# Patient Record
Sex: Female | Born: 1940
Health system: Southern US, Community
[De-identification: ages and names within clinical notes are randomized; demographics above are authoritative.]

## PROBLEM LIST (undated history)

## (undated) DIAGNOSIS — Z972 Presence of dental prosthetic device (complete) (partial): Secondary | ICD-10-CM

## (undated) DIAGNOSIS — J449 Chronic obstructive pulmonary disease, unspecified: Secondary | ICD-10-CM

## (undated) DIAGNOSIS — R519 Headache, unspecified: Secondary | ICD-10-CM

## (undated) DIAGNOSIS — M1990 Unspecified osteoarthritis, unspecified site: Secondary | ICD-10-CM

## (undated) DIAGNOSIS — I10 Essential (primary) hypertension: Secondary | ICD-10-CM

## (undated) DIAGNOSIS — K529 Noninfective gastroenteritis and colitis, unspecified: Secondary | ICD-10-CM

## (undated) DIAGNOSIS — K5790 Diverticulosis of intestine, part unspecified, without perforation or abscess without bleeding: Secondary | ICD-10-CM

## (undated) DIAGNOSIS — K635 Polyp of colon: Secondary | ICD-10-CM

## (undated) DIAGNOSIS — R42 Dizziness and giddiness: Secondary | ICD-10-CM

## (undated) DIAGNOSIS — R0600 Dyspnea, unspecified: Secondary | ICD-10-CM

## (undated) DIAGNOSIS — K219 Gastro-esophageal reflux disease without esophagitis: Secondary | ICD-10-CM

## (undated) DIAGNOSIS — M419 Scoliosis, unspecified: Secondary | ICD-10-CM

## (undated) DIAGNOSIS — R51 Headache: Secondary | ICD-10-CM

## (undated) HISTORY — PX: HERNIA REPAIR: SHX51

## (undated) HISTORY — PX: ABDOMINAL HYSTERECTOMY: SHX81

---

## 2005-11-15 ENCOUNTER — Ambulatory Visit: Payer: Self-pay | Admitting: General Surgery

## 2005-11-19 ENCOUNTER — Ambulatory Visit: Payer: Self-pay | Admitting: General Surgery

## 2006-08-06 ENCOUNTER — Ambulatory Visit: Payer: Self-pay | Admitting: Internal Medicine

## 2009-09-05 ENCOUNTER — Ambulatory Visit: Payer: Self-pay | Admitting: Internal Medicine

## 2010-09-12 ENCOUNTER — Ambulatory Visit: Payer: Self-pay | Admitting: Internal Medicine

## 2011-02-17 ENCOUNTER — Ambulatory Visit: Payer: Self-pay | Admitting: Family Medicine

## 2011-09-07 ENCOUNTER — Emergency Department: Payer: Self-pay | Admitting: Emergency Medicine

## 2011-09-16 ENCOUNTER — Observation Stay: Payer: Self-pay | Admitting: Internal Medicine

## 2011-10-02 ENCOUNTER — Ambulatory Visit: Payer: Self-pay | Admitting: Unknown Physician Specialty

## 2011-10-24 ENCOUNTER — Ambulatory Visit: Payer: Self-pay

## 2011-11-20 ENCOUNTER — Ambulatory Visit: Payer: Self-pay | Admitting: Internal Medicine

## 2012-11-23 ENCOUNTER — Ambulatory Visit: Payer: Self-pay | Admitting: Internal Medicine

## 2013-02-04 ENCOUNTER — Emergency Department: Payer: Self-pay | Admitting: Emergency Medicine

## 2013-03-22 ENCOUNTER — Ambulatory Visit: Payer: Self-pay | Admitting: Orthopedic Surgery

## 2013-03-24 ENCOUNTER — Ambulatory Visit: Payer: Self-pay | Admitting: Anesthesiology

## 2013-03-24 LAB — POTASSIUM: Potassium: 3.6 mmol/L (ref 3.5–5.1)

## 2013-03-25 ENCOUNTER — Ambulatory Visit: Payer: Self-pay | Admitting: Orthopedic Surgery

## 2013-11-30 ENCOUNTER — Ambulatory Visit: Payer: Self-pay | Admitting: Internal Medicine

## 2014-04-06 DIAGNOSIS — M419 Scoliosis, unspecified: Secondary | ICD-10-CM | POA: Insufficient documentation

## 2014-04-06 DIAGNOSIS — G44209 Tension-type headache, unspecified, not intractable: Secondary | ICD-10-CM | POA: Insufficient documentation

## 2014-07-08 DIAGNOSIS — E538 Deficiency of other specified B group vitamins: Secondary | ICD-10-CM | POA: Insufficient documentation

## 2014-07-08 DIAGNOSIS — K219 Gastro-esophageal reflux disease without esophagitis: Secondary | ICD-10-CM | POA: Insufficient documentation

## 2015-03-03 NOTE — Op Note (Signed)
PATIENT NAME:  Karina Stone, Karina Stone MR#:  [id number] DATE OF BIRTH:  Mar 21, 1941  DATE OF PROCEDURE:  03/25/2013  PREOPERATIVE DIAGNOSIS:  Right gamekeeper thumb, ulnar collateral ligament injury.   POSTOPERATIVE DIAGNOSIS:  Right gamekeeper thumb, ulnar collateral ligament injury.   PROCEDURE:  Repair of ulnar collateral ligament, right thumb MCP joint.   ANESTHESIA:  General.   SURGEON:  Laurene Footman, M.D.   DESCRIPTION OF PROCEDURE:  The patient was brought to the operating room and after adequate general anesthesia was obtained, the right arm was prepped and draped in the usual sterile fashion with a tourniquet applied to the upper arm. After prepping and draping the arm in the usual sterile manner, patient identification and timeout procedures were completed. The arm was exsanguinated with an Esmarch and the tourniquet raised to 250 mmHg. An incision was made over the dorsal ulnar aspect of the MCP joint. The subcutaneous tissue was spread and the dorsal ulnar cutaneous nerve protected. The adductor aponeurosis was incised, and the ligament found to have been trapped under it. The ligament was freed up without much difficulty and had avulsed off the proximal phalanx. The bare area was then roughened with a small periosteal elevator and 2 suture anchors were placed after first predrilling at the appropriate location with a to DePuy Mitek Microfix QuickAnchor Plus with 3-0 suture attached. These sutures were then placed through the ligament in a mattress-type suture and when tightened restored ligament tension. The thumb was stable to exam at this point with stressing. The adductor aponeurosis then repaired using 4-0 Vicryl in a running manner. The wound was thoroughly irrigated at this point, and the skin closed with simple interrupted 4-0 nylon. Xeroform, 4 x 4's, Webril  and thumb spica brace were applied with the thumb in adduction.   ESTIMATED BLOOD LOSS: Minimal.   COMPLICATIONS: None.    SPECIMEN: None.   IMPLANTS:  DePuy Mitek Microfix QuickAnchor Plus x 2.   TOURNIQUET TIME: 26 minutes.    CONDITION: To recovery room stable.    ____________________________ Laurene Footman, MD mjm:dmm D: 03/25/2013 21:18:59 ET T: 03/25/2013 21:51:58 ET JOB#: [id number]  cc: Laurene Footman, MD, <Dictator> Laurene Footman MD ELECTRONICALLY SIGNED 03/26/2013 8:07

## 2015-09-27 DIAGNOSIS — K580 Irritable bowel syndrome with diarrhea: Secondary | ICD-10-CM | POA: Insufficient documentation

## 2016-02-02 DIAGNOSIS — L821 Other seborrheic keratosis: Secondary | ICD-10-CM | POA: Diagnosis not present

## 2016-02-02 DIAGNOSIS — L570 Actinic keratosis: Secondary | ICD-10-CM | POA: Diagnosis not present

## 2016-02-02 DIAGNOSIS — X32XXXA Exposure to sunlight, initial encounter: Secondary | ICD-10-CM | POA: Diagnosis not present

## 2016-02-02 DIAGNOSIS — Z85828 Personal history of other malignant neoplasm of skin: Secondary | ICD-10-CM | POA: Diagnosis not present

## 2016-07-09 DIAGNOSIS — I10 Essential (primary) hypertension: Secondary | ICD-10-CM | POA: Diagnosis not present

## 2016-07-09 DIAGNOSIS — E538 Deficiency of other specified B group vitamins: Secondary | ICD-10-CM | POA: Diagnosis not present

## 2016-07-09 DIAGNOSIS — Z0000 Encounter for general adult medical examination without abnormal findings: Secondary | ICD-10-CM | POA: Diagnosis not present

## 2016-07-16 DIAGNOSIS — D126 Benign neoplasm of colon, unspecified: Secondary | ICD-10-CM | POA: Diagnosis not present

## 2016-07-16 DIAGNOSIS — Z0000 Encounter for general adult medical examination without abnormal findings: Secondary | ICD-10-CM | POA: Diagnosis not present

## 2016-07-16 DIAGNOSIS — Z23 Encounter for immunization: Secondary | ICD-10-CM | POA: Diagnosis not present

## 2016-07-26 ENCOUNTER — Other Ambulatory Visit: Payer: Self-pay | Admitting: Internal Medicine

## 2016-07-26 DIAGNOSIS — Z1231 Encounter for screening mammogram for malignant neoplasm of breast: Secondary | ICD-10-CM

## 2016-07-30 ENCOUNTER — Ambulatory Visit: Payer: Self-pay | Attending: Internal Medicine

## 2016-08-20 DIAGNOSIS — Z800 Family history of malignant neoplasm of digestive organs: Secondary | ICD-10-CM | POA: Diagnosis not present

## 2016-08-20 DIAGNOSIS — K529 Noninfective gastroenteritis and colitis, unspecified: Secondary | ICD-10-CM | POA: Diagnosis not present

## 2016-08-20 DIAGNOSIS — Z86010 Personal history of colonic polyps: Secondary | ICD-10-CM | POA: Diagnosis not present

## 2016-09-13 ENCOUNTER — Encounter: Payer: Self-pay | Admitting: Radiology

## 2016-09-13 ENCOUNTER — Ambulatory Visit
Admission: RE | Admit: 2016-09-13 | Discharge: 2016-09-13 | Disposition: A | Discharge status: Home / Self Care | Payer: PPO | Source: Ambulatory Visit | Attending: Internal Medicine | Admitting: Internal Medicine

## 2016-09-13 DIAGNOSIS — Z1231 Encounter for screening mammogram for malignant neoplasm of breast: Secondary | ICD-10-CM | POA: Diagnosis not present

## 2016-10-24 DIAGNOSIS — L574 Cutis laxa senilis: Secondary | ICD-10-CM | POA: Diagnosis not present

## 2016-11-15 ENCOUNTER — Ambulatory Visit
Admission: RE | Admit: 2016-11-15 | Discharge: 2016-11-15 | Disposition: A | Discharge status: Home / Self Care | Payer: PPO | Source: Ambulatory Visit | Attending: Unknown Physician Specialty | Admitting: Unknown Physician Specialty

## 2016-11-15 ENCOUNTER — Ambulatory Visit: Payer: PPO | Admitting: Anesthesiology

## 2016-11-15 ENCOUNTER — Encounter
Admission: RE | Disposition: A | Discharge status: Home / Self Care | Payer: Self-pay | Source: Ambulatory Visit | Attending: Unknown Physician Specialty

## 2016-11-15 DIAGNOSIS — Z5309 Procedure and treatment not carried out because of other contraindication: Secondary | ICD-10-CM | POA: Insufficient documentation

## 2016-11-15 DIAGNOSIS — Z86010 Personal history of colonic polyps: Secondary | ICD-10-CM | POA: Insufficient documentation

## 2016-11-15 DIAGNOSIS — Z1211 Encounter for screening for malignant neoplasm of colon: Secondary | ICD-10-CM | POA: Diagnosis not present

## 2016-11-15 DIAGNOSIS — Z87891 Personal history of nicotine dependence: Secondary | ICD-10-CM | POA: Insufficient documentation

## 2016-11-15 DIAGNOSIS — R51 Headache: Secondary | ICD-10-CM | POA: Diagnosis not present

## 2016-11-15 HISTORY — DX: Headache: R51

## 2016-11-15 HISTORY — DX: Dyspnea, unspecified: R06.00

## 2016-11-15 HISTORY — DX: Headache, unspecified: R51.9

## 2016-11-15 HISTORY — DX: Diverticulosis of intestine, part unspecified, without perforation or abscess without bleeding: K57.90

## 2016-11-15 HISTORY — DX: Essential (primary) hypertension: I10

## 2016-11-15 HISTORY — DX: Chronic obstructive pulmonary disease, unspecified: J44.9

## 2016-11-15 HISTORY — DX: Noninfective gastroenteritis and colitis, unspecified: K52.9

## 2016-11-15 HISTORY — DX: Scoliosis, unspecified: M41.9

## 2016-11-15 HISTORY — DX: Polyp of colon: K63.5

## 2016-11-15 SURGERY — COLONOSCOPY WITH PROPOFOL
Anesthesia: General

## 2016-11-15 MED ORDER — SODIUM CHLORIDE 0.9 % IV SOLN
INTRAVENOUS | Status: DC
  Administered 2016-11-15: 10:00:00 via INTRAVENOUS

## 2016-11-15 MED ORDER — FLEET ENEMA 7-19 GM/118ML RE ENEM
1.0000 | ENEMA | Freq: Once | RECTAL | Status: AC
  Administered 2016-11-15: 1 via RECTAL

## 2016-11-15 NOTE — OR Nursing (Signed)
Results of fleets enema brown liquid stool. Not clear. Dr Vira Agar informed. md to make a decision after discussing with pt and family

## 2016-11-15 NOTE — OR Nursing (Signed)
Pt only completed 1/2 of prep . Spoke with dr Vira Agar who ordered fleets enema

## 2016-11-15 NOTE — Anesthesia Preprocedure Evaluation (Signed)
Anesthesia Evaluation  Patient identified by MRN, date of birth, ID band Patient awake    Reviewed: Allergy & Precautions, H&P , NPO status , Patient's Chart, lab work & pertinent test results  History of Anesthesia Complications Negative for: history of anesthetic complications  Airway Mallampati: III  TM Distance: <3 FB Neck ROM: limited    Dental  (+) Poor Dentition, Missing, Upper Dentures, Edentulous Lower   Pulmonary neg shortness of breath, COPD, former smoker,    Pulmonary exam normal breath sounds clear to auscultation       Cardiovascular Exercise Tolerance: Good hypertension, (-) angina(-) Past MI and (-) DOE Normal cardiovascular exam Rhythm:regular Rate:Normal     Neuro/Psych  Headaches, negative psych ROS   GI/Hepatic negative GI ROS, Neg liver ROS, neg GERD  ,  Endo/Other  negative endocrine ROS  Renal/GU negative Renal ROS  negative genitourinary   Musculoskeletal   Abdominal   Peds  Hematology negative hematology ROS (+)   Anesthesia Other Findings Past Medical History: No date: Chronic diarrhea No date: Colon polyps No date: COPD (chronic obstructive pulmonary disease) (* No date: Diverticulosis No date: Dyspnea No date: Headache No date: Hypertension No date: Thoracic scoliosis  Past Surgical History: No date: ABDOMINAL HYSTERECTOMY No date: HERNIA REPAIR  BMI    Body Mass Index:  23.30 kg/m      Reproductive/Obstetrics negative OB ROS                             Anesthesia Physical Anesthesia Plan  ASA: III  Anesthesia Plan: General   Post-op Pain Management:    Induction:   Airway Management Planned:   Additional Equipment:   Intra-op Plan:   Post-operative Plan:   Informed Consent: I have reviewed the patients History and Physical, chart, labs and discussed the procedure including the risks, benefits and alternatives for the proposed  anesthesia with the patient or authorized representative who has indicated his/her understanding and acceptance.   Dental Advisory Given  Plan Discussed with: Anesthesiologist, CRNA and Surgeon  Anesthesia Plan Comments:         Anesthesia Quick Evaluation

## 2016-11-15 NOTE — H&P (Signed)
Patient was not cleaned out and she was given a fleets enema and there was liquid stool showing evidence of not being clear enough to do a colonoscopy.  I offered her a choice of drinking more prep at this time or rescheduling and starting all over again at another time.  She chose to wait a few weeks or months and try all over again.

## 2016-11-29 ENCOUNTER — Ambulatory Visit: Admit: 2016-11-29 | Payer: PPO | Admitting: Unknown Physician Specialty

## 2016-11-29 SURGERY — COLONOSCOPY WITH PROPOFOL
Anesthesia: General

## 2017-01-09 DIAGNOSIS — L574 Cutis laxa senilis: Secondary | ICD-10-CM | POA: Diagnosis not present

## 2017-02-10 ENCOUNTER — Encounter: Payer: Self-pay | Admitting: *Deleted

## 2017-02-11 ENCOUNTER — Ambulatory Visit
Admission: RE | Admit: 2017-02-11 | Discharge: 2017-02-11 | Disposition: A | Discharge status: Home / Self Care | Payer: PPO | Source: Ambulatory Visit | Attending: Ophthalmology | Admitting: Ophthalmology

## 2017-02-11 ENCOUNTER — Ambulatory Visit: Payer: PPO | Admitting: Anesthesiology

## 2017-02-11 ENCOUNTER — Encounter
Admission: RE | Disposition: A | Discharge status: Home / Self Care | Payer: Self-pay | Source: Ambulatory Visit | Attending: Ophthalmology

## 2017-02-11 DIAGNOSIS — K219 Gastro-esophageal reflux disease without esophagitis: Secondary | ICD-10-CM | POA: Insufficient documentation

## 2017-02-11 DIAGNOSIS — J449 Chronic obstructive pulmonary disease, unspecified: Secondary | ICD-10-CM | POA: Insufficient documentation

## 2017-02-11 DIAGNOSIS — R51 Headache: Secondary | ICD-10-CM | POA: Diagnosis not present

## 2017-02-11 DIAGNOSIS — Z90710 Acquired absence of both cervix and uterus: Secondary | ICD-10-CM | POA: Insufficient documentation

## 2017-02-11 DIAGNOSIS — Z888 Allergy status to other drugs, medicaments and biological substances status: Secondary | ICD-10-CM | POA: Diagnosis not present

## 2017-02-11 DIAGNOSIS — T8131XA Disruption of external operation (surgical) wound, not elsewhere classified, initial encounter: Secondary | ICD-10-CM | POA: Diagnosis not present

## 2017-02-11 DIAGNOSIS — X58XXXA Exposure to other specified factors, initial encounter: Secondary | ICD-10-CM | POA: Diagnosis not present

## 2017-02-11 DIAGNOSIS — Z87891 Personal history of nicotine dependence: Secondary | ICD-10-CM | POA: Diagnosis not present

## 2017-02-11 DIAGNOSIS — I10 Essential (primary) hypertension: Secondary | ICD-10-CM | POA: Insufficient documentation

## 2017-02-11 DIAGNOSIS — Z885 Allergy status to narcotic agent status: Secondary | ICD-10-CM | POA: Insufficient documentation

## 2017-02-11 HISTORY — DX: Dizziness and giddiness: R42

## 2017-02-11 HISTORY — DX: Unspecified osteoarthritis, unspecified site: M19.90

## 2017-02-11 HISTORY — DX: Presence of dental prosthetic device (complete) (partial): Z97.2

## 2017-02-11 HISTORY — PX: BROW LIFT: SHX178

## 2017-02-11 HISTORY — DX: Gastro-esophageal reflux disease without esophagitis: K21.9

## 2017-02-11 SURGERY — BLEPHAROPLASTY
Anesthesia: Monitor Anesthesia Care | Site: Eye | Laterality: Bilateral | Wound class: Clean Contaminated

## 2017-02-11 MED ORDER — BSS IO SOLN
INTRAOCULAR | Status: DC | PRN
  Administered 2017-02-11: 5 mL via INTRAOCULAR

## 2017-02-11 MED ORDER — PROPOFOL 10 MG/ML IV BOLUS
INTRAVENOUS | Status: DC | PRN
  Administered 2017-02-11: 20 mg via INTRAVENOUS

## 2017-02-11 MED ORDER — TETRACAINE HCL 0.5 % OP SOLN
OPHTHALMIC | Status: DC | PRN
  Administered 2017-02-11: 2 [drp] via OPHTHALMIC

## 2017-02-11 MED ORDER — OXYCODONE HCL 5 MG/5ML PO SOLN: 5.0000 mg | Freq: Once | ORAL | Status: DC | PRN

## 2017-02-11 MED ORDER — LIDOCAINE-EPINEPHRINE 2 %-1:100000 IJ SOLN
INTRAMUSCULAR | Status: DC | PRN
  Administered 2017-02-11: 4.5 mL via OPHTHALMIC

## 2017-02-11 MED ORDER — MIDAZOLAM HCL 2 MG/2ML IJ SOLN
INTRAMUSCULAR | Status: DC | PRN
  Administered 2017-02-11: 2 mg via INTRAVENOUS

## 2017-02-11 MED ORDER — ONDANSETRON HCL 4 MG/2ML IJ SOLN: 4.0000 mg | Freq: Once | INTRAMUSCULAR | Status: DC | PRN

## 2017-02-11 MED ORDER — LACTATED RINGERS IV SOLN
INTRAVENOUS | Status: DC
  Administered 2017-02-11: 08:00:00 via INTRAVENOUS

## 2017-02-11 MED ORDER — PROPOFOL 500 MG/50ML IV EMUL
INTRAVENOUS | Status: DC | PRN
  Administered 2017-02-11: 25 ug/kg/min via INTRAVENOUS

## 2017-02-11 MED ORDER — FENTANYL CITRATE (PF) 100 MCG/2ML IJ SOLN: 25.0000 ug | INTRAMUSCULAR | Status: DC | PRN

## 2017-02-11 MED ORDER — ALFENTANIL 500 MCG/ML IJ INJ
INJECTION | INTRAMUSCULAR | Status: DC | PRN
  Administered 2017-02-11: 500 ug via INTRAVENOUS

## 2017-02-11 MED ORDER — ERYTHROMYCIN 5 MG/GM OP OINT: TOPICAL_OINTMENT | OPHTHALMIC | Status: DC | PRN

## 2017-02-11 MED ORDER — OXYCODONE HCL 5 MG PO TABS: 5.0000 mg | ORAL_TABLET | Freq: Once | ORAL | Status: DC | PRN

## 2017-02-11 SURGICAL SUPPLY — 38 items
ADHESIVE MASTISOL STRL (MISCELLANEOUS) ×3 IMPLANT
APPLICATOR COTTON TIP WD 3 STR (MISCELLANEOUS) ×3 IMPLANT
BLADE SURG 15 STRL LF DISP TIS (BLADE) ×1 IMPLANT
BLADE SURG 15 STRL SS (BLADE) ×2
CLOSURE WOUND 1/2 X4 (GAUZE/BANDAGES/DRESSINGS) ×1
CORD BIP STRL DISP 12FT (MISCELLANEOUS) ×3 IMPLANT
DRAPE HEAD BAR (DRAPES) ×3 IMPLANT
GAUZE SPONGE 4X4 12PLY STRL (GAUZE/BANDAGES/DRESSINGS) ×3 IMPLANT
GAUZE SPONGE NON-WVN 2X2 STRL (MISCELLANEOUS) ×10 IMPLANT
GLOVE SURG LX 7.0 MICRO (GLOVE) ×4
GLOVE SURG LX STRL 7.0 MICRO (GLOVE) ×2 IMPLANT
MARKER SKIN XFINE TIP W/RULER (MISCELLANEOUS) ×3 IMPLANT
NEEDLE FILTER BLUNT 18X 1/2SAF (NEEDLE) ×2
NEEDLE FILTER BLUNT 18X1 1/2 (NEEDLE) ×1 IMPLANT
NEEDLE HYPO 30X.5 LL (NEEDLE) ×6 IMPLANT
PACK DRAPE NASAL/ENT (PACKS) ×3 IMPLANT
SOL PREP PVP 2OZ (MISCELLANEOUS) ×3
SOLUTION PREP PVP 2OZ (MISCELLANEOUS) ×1 IMPLANT
SPONGE VERSALON 2X2 STRL (MISCELLANEOUS) ×20
STRIP CLOSURE SKIN 1/2X4 (GAUZE/BANDAGES/DRESSINGS) ×2 IMPLANT
SUT CHROMIC 4-0 (SUTURE)
SUT CHROMIC 4-0 M2 12X2 ARM (SUTURE)
SUT CHROMIC 5 0 P 3 (SUTURE) IMPLANT
SUT ETHILON 4 0 CL P 3 (SUTURE) IMPLANT
SUT MERSILENE 4-0 S-2 (SUTURE) IMPLANT
SUT PLAIN GUT (SUTURE) ×3 IMPLANT
SUT PROLENE 5 0 P 3 (SUTURE) ×3 IMPLANT
SUT PROLENE 6 0 P 1 18 (SUTURE) IMPLANT
SUT SILK 4 0 G 3 (SUTURE) IMPLANT
SUT VIC AB 5-0 P-3 18X BRD (SUTURE) IMPLANT
SUT VIC AB 5-0 P3 18 (SUTURE)
SUT VICRYL 6-0  S14 CTD (SUTURE) ×4
SUT VICRYL 6-0 S14 CTD (SUTURE) ×2 IMPLANT
SUT VICRYL 7 0 TG140 8 (SUTURE) IMPLANT
SUTURE CHRMC 4-0 M2 12X2 ARM (SUTURE) IMPLANT
SYR 3ML LL SCALE MARK (SYRINGE) ×3 IMPLANT
SYRINGE 10CC LL (SYRINGE) ×3 IMPLANT
WATER STERILE IRR 500ML POUR (IV SOLUTION) ×3 IMPLANT

## 2017-02-11 NOTE — Interval H&P Note (Signed)
History and Physical Interval Note:  02/11/2017 8:37 AM  Karina Stone  has presented today for surgery, with the diagnosis of  T81.31xA wound dehisence  The various methods of treatment have been discussed with the patient and family. After consideration of risks, benefits and other options for treatment, the patient has consented to  Procedure(s): bilateral secondary closure surgical wound dehisence extensive (Bilateral) as a surgical intervention .  The patient's history has been reviewed, patient examined, no change in status, stable for surgery.  I have reviewed the patient's chart and labs.  Questions were answered to the patient's satisfaction.     Vickki Muff, Teddrick Mallari M

## 2017-02-11 NOTE — Op Note (Signed)
Preoperative Diagnosis:   Wound dehiscence bilateral brows.    Postoperative Diagnosis: Same.   Procedure(s) Performed:   Bilateral secondary closure of surgical wound dehiscence-extensive  Teaching Surgeon: Philis Pique. Vickki Muff, M.D.   Assistants: None   Anesthesia: MAC  Specimens: None.  Estimated Blood Loss: Minimal.  Complications: None.  Operative Findings: None Dictated  PROCEDURE:   Allergies were reviewed and the patient is allergic to codeine; amitriptyline; and norvasc [amlodipine besylate]..   After the risks, benefits, complications and alternatives were discussed with the patient, appropriate informed consent was obtained.  The patient was then brought to the operating suite and reclined supine.  Timeout was conducted and the patient was sedated. Local anesthetic consisting of a 50-50 mixture of 2% lidocaine with epinephrine and 0.75% bupivacaine with added Hylenex was injected subcutaneously to the bilateral brow region(s) and down to the periosteum. After adequate local was instilled, the patient was prepped and draped in the usual sterile fashion for eyelid surgery.   Attention was turned to the right brow region.  The area of necrotic tissue was outlined with a sterile marking pen. A #15 blade was used to create a bevelled incision along the premarked incision line. A skin and subcutaneous tissue flap was then excised and hemostasis was obtained with bipolar cautery. Necrotic tissue was debridement. 10% Betadine solution was then painted on the wound and allowed to sit for 2 minutes by irrigation with balance salt solution. It was noted that no evidence of the previously placed 5-0 chromic sutures was present. The deep tissues were reapproximated with interrupted horizontal followed by vertical vertical 6-0 Vicryl sutures. The skin margin was reapproximated with 3 vertical mattress sutures followed by a running locking 5-0 Prolene suture. Attention was then turned to the  opposite brow region where the same procedure was performed in the same manner.    The patient tolerated the procedure well.  The wounds were dressed with Mastisol and half-inch Steri-Strips The patient was taken to the recovery area where she recovered without difficulty.  Post-Op Plan/Instructions:  The patient was instructed to try to keep her Steri-Strips dry for his many days possible.  Once the strip start to fall off on their own,  she  was instructed to use over-the-counter triple antibiotic ointment with no steroid on the brow sutures 4 times a day for the next 12 to 14 days. she was given a prescription for Percocet for pain control should Tylenol not be effective. she was asked to to follow up in 3 weeks time for suture removal or sooner as needed for problems.   Manveer Gomes M. Vickki Muff, M.D. Attending,Ophthalmology

## 2017-02-11 NOTE — H&P (Signed)
See the history and physical completed at [facility] on 02/06/17 and scanned into the chart.

## 2017-02-11 NOTE — Anesthesia Preprocedure Evaluation (Signed)
Anesthesia Evaluation  Patient identified by MRN, date of birth, ID band Patient awake    Reviewed: Allergy & Precautions, H&P , NPO status , Patient's Chart, lab work & pertinent test results, reviewed documented beta blocker date and time   Airway Mallampati: II  TM Distance: >3 FB Neck ROM: full    Dental  (+) Upper Dentures, Lower Dentures   Pulmonary shortness of breath, COPD, former smoker,    Pulmonary exam normal breath sounds clear to auscultation       Cardiovascular Exercise Tolerance: Good hypertension,  Rhythm:regular Rate:Normal     Neuro/Psych  Headaches, negative psych ROS   GI/Hepatic Neg liver ROS, GERD  ,  Endo/Other  negative endocrine ROS  Renal/GU negative Renal ROS  negative genitourinary   Musculoskeletal   Abdominal   Peds  Hematology negative hematology ROS (+)   Anesthesia Other Findings   Reproductive/Obstetrics negative OB ROS                             Anesthesia Physical Anesthesia Plan  ASA: II  Anesthesia Plan: MAC   Post-op Pain Management:    Induction:   Airway Management Planned:   Additional Equipment:   Intra-op Plan:   Post-operative Plan:   Informed Consent: I have reviewed the patients History and Physical, chart, labs and discussed the procedure including the risks, benefits and alternatives for the proposed anesthesia with the patient or authorized representative who has indicated his/her understanding and acceptance.   Dental Advisory Given  Plan Discussed with: CRNA  Anesthesia Plan Comments:         Anesthesia Quick Evaluation

## 2017-02-11 NOTE — Discharge Instructions (Signed)
INSTRUCTIONS FOLLOWING OCULOPLASTIC SURGERY Karina Dennie Maizes, MD  AFTER YOUR EYE SURGERY, THER ARE MANY THINGS [place] YOU, THE PATIENT, CAN DO TO ASSURE THE BEST POSSIBLE RESULT FROM YOUR OPERATION.  THIS SHEET SHOULD BE REFERRED TO WHENEVER QUESTIONS ARISE.  IF THERE ARE ANY QUESTIONS NOT ANSWERED HERE, DO NOT HESITATE TO CALL OUR OFFICE AT [phone] OR [phone].  THERE IS ALWAYS [facility] AVAILABLE TO CALL IF QUESTIONS OR PROBLEMS ARISE.  VISION: Your vision may be blurred and out of focus after surgery until you are able to stop using your ointment, swelling resolves and your eye(s) heal. This may take 1 to 2 weeks at the least.  If your vision becomes gradually more dim or dark, this is not normal and you need to call our office immediately.  EYE CARE: For the first 48 hours after surgery, use ice packs 4-5 times today and tomorrow - 10-15 minutes at a time  - to help reduce swelling and bruising.  Small bags of frozen peas or corn make good ice packs along with cloths soaked in ice water.  You are wearing a special type of dressing following surgery - do not try to peel this off until they have significantly loosen on their own (typically 3-4 days). KEEP THIS DEREESING DRY!!   Once the dressing does come off, start using over the counter triple antibiotic ointment 4 times a day on your stitches.  For the first  2 weeks following surgery, you will need to treat your stitches with great care.  If is OK to shower, but take care to not allow soapy water to run into your eye(s) to help reduce changes of infection.  You may gently clean the eyelashes and around the eye(s) with cotton balls and sterile water, BUT DO NOT RUB THE STITCHES VIGOROUSLY.  Keeping your stitches moist with ointment will help promote healing with minimal scar formation.  ACTIVITY: When you leave the surgery center, you should go home, rest and be inactive.  The eye(s) may feel scratchy and keeping the eyes closed will allow for  faster healing.  The first week following surgery, avoid straining (anything making the face turn red) or lifting over 20 pounds.  Additionally, avoid bending which causes your head to go below your waist.  Using your eyes will NOT harm them, so feel free to read, watch television, use the computer, etc as desired.  Driving depends on each individual, so check with your doctor if you have questions about driving.  MEDICATIONS:  You will be given a prescription for an ointment to use 4 times a day on your stitches.  You can use the ointment in your eyes if they feel scratchy or irritated.  If you eyelid(s) dont close completely when you sleep, put some ointment in your eyes before bedtime.  EMERGENCY: If you experience SEVERE EYE PAIN OR HEADACHE UNRELIEVED BY TYLENOL OR PERCOCET, NAUSEA OR VOMITING, WORSENING REDNESS, OR WORSENING VISION (ESPECIALLY VISION THAT WA INITIALLY BETTER) CALL [phone] OR [phone] DURING BUSINESS HOURS OR AFTER HOURS.  General Anesthesia, Adult, Care After These instructions provide you with information about caring for yourself after your procedure. Your health care provider may also give you more specific instructions. Your treatment has been planned according to current medical practices, but problems sometimes occur. Call your health care provider if you have any problems or questions after your procedure. What can I expect after the procedure? After the procedure, it is common to have:  Vomiting.  A  sore throat.  Mental slowness. It is common to feel:  Nauseous.  Cold or shivery.  Sleepy.  Tired.  Sore or achy, even in parts of your body where you did not have surgery. Follow these instructions at home: For at least 24 hours after the procedure:   Do not:  Participate in activities where you could fall or become injured.  Drive.  Use heavy machinery.  Drink alcohol.  Take sleeping pills or medicines that cause drowsiness.  Make  important decisions or sign legal documents.  Take care of children on your own.  Rest. Eating and drinking   If you vomit, drink water, juice, or soup when you can drink without vomiting.  Drink enough fluid to keep your urine clear or pale yellow.  Make sure you have little or no nausea before eating solid foods.  Follow the diet recommended by your health care provider. General instructions   Have a responsible adult stay with you until you are awake and alert.  Return to your normal activities as told by your health care provider. Ask your health care provider what activities are safe for you.  Take over-the-counter and prescription medicines only as told by your health care provider.  If you smoke, do not smoke without supervision.  Keep all follow-up visits as told by your health care provider. This is important. Contact a health care provider if:  You continue to have nausea or vomiting at home, and medicines are not helpful.  You cannot drink fluids or start eating again.  You cannot urinate after 8-12 hours.  You develop a skin rash.  You have fever.  You have increasing redness at the site of your procedure. Get help right away if:  You have difficulty breathing.  You have chest pain.  You have unexpected bleeding.  You feel that you are having a life-threatening or urgent problem. This information is not intended to replace advice given to you by your health care provider. Make sure you discuss any questions you have with your health care provider. Document Released: 02/03/2001 Document Revised: 04/01/2016 Document Reviewed: 10/12/2015 Elsevier Interactive Patient Education  2017 [employer].

## 2017-02-11 NOTE — Transfer of Care (Signed)
Immediate Anesthesia Transfer of Care Note  Patient: Karina Stone  Procedure(s) Performed: Procedure(s): bilateral secondary closure surgical wound dehisence extensive (Bilateral)  Patient Location: PACU  Anesthesia Type: MAC  Level of Consciousness: awake, alert  and patient cooperative  Airway and Oxygen Therapy: Patient Spontanous Breathing and Patient connected to supplemental oxygen  Post-op Assessment: Post-op Vital signs reviewed, Patient's Cardiovascular Status Stable, Respiratory Function Stable, Patent Airway and No signs of Nausea or vomiting  Post-op Vital Signs: Reviewed and stable  Complications: No apparent anesthesia complications

## 2017-02-11 NOTE — Anesthesia Postprocedure Evaluation (Signed)
Anesthesia Post Note  Patient: Karina Stone  Procedure(s) Performed: Procedure(s) (LRB): bilateral secondary closure surgical wound dehisence extensive (Bilateral)  Patient location during evaluation: PACU Anesthesia Type: MAC Level of consciousness: awake and alert Pain management: pain level controlled Vital Signs Assessment: post-procedure vital signs reviewed and stable Respiratory status: spontaneous breathing, nonlabored ventilation, respiratory function stable and patient connected to nasal cannula oxygen Cardiovascular status: stable and blood pressure returned to baseline Anesthetic complications: no    Alisa Graff

## 2017-02-11 NOTE — Interval H&P Note (Signed)
History and Physical Interval Note:  02/11/2017 8:37 AM  Karina Stone  has presented today for surgery, with the diagnosis of  T81.31xA wound dehisence  The various methods of treatment have been discussed with the patient and family. After consideration of risks, benefits and other options for treatment, the patient has consented to  Procedure(s): bilateral secondary closure surgical wound dehisence extensive (Bilateral) as a surgical intervention .  The patient's history has been reviewed, patient examined, no change in status, stable for surgery.  I have reviewed the patient's chart and labs.  Questions were answered to the patient's satisfaction.     Vickki Muff, Naquita Nappier M

## 2017-02-11 NOTE — Anesthesia Procedure Notes (Signed)
Procedure Name: MAC Date/Time: 02/11/2017 8:48 AM Performed by: Janna Arch Pre-anesthesia Checklist: Patient identified, Emergency Drugs available, Suction available and Patient being monitored Patient Re-evaluated:Patient Re-evaluated prior to inductionOxygen Delivery Method: Nasal cannula

## 2017-02-12 ENCOUNTER — Encounter: Payer: Self-pay | Admitting: Ophthalmology

## 2017-05-13 DIAGNOSIS — M79675 Pain in left toe(s): Secondary | ICD-10-CM | POA: Diagnosis not present

## 2017-05-13 DIAGNOSIS — M79674 Pain in right toe(s): Secondary | ICD-10-CM | POA: Diagnosis not present

## 2017-05-13 DIAGNOSIS — B351 Tinea unguium: Secondary | ICD-10-CM | POA: Diagnosis not present

## 2017-06-06 DIAGNOSIS — M79675 Pain in left toe(s): Secondary | ICD-10-CM | POA: Diagnosis not present

## 2017-06-06 DIAGNOSIS — M79674 Pain in right toe(s): Secondary | ICD-10-CM | POA: Diagnosis not present

## 2017-06-06 DIAGNOSIS — L03031 Cellulitis of right toe: Secondary | ICD-10-CM | POA: Diagnosis not present

## 2017-06-25 DIAGNOSIS — E782 Mixed hyperlipidemia: Secondary | ICD-10-CM | POA: Insufficient documentation

## 2017-06-25 DIAGNOSIS — I10 Essential (primary) hypertension: Secondary | ICD-10-CM | POA: Insufficient documentation

## 2017-07-10 DIAGNOSIS — Z0000 Encounter for general adult medical examination without abnormal findings: Secondary | ICD-10-CM | POA: Diagnosis not present

## 2017-07-10 DIAGNOSIS — E782 Mixed hyperlipidemia: Secondary | ICD-10-CM | POA: Diagnosis not present

## 2017-07-10 DIAGNOSIS — E538 Deficiency of other specified B group vitamins: Secondary | ICD-10-CM | POA: Diagnosis not present

## 2017-07-17 DIAGNOSIS — E782 Mixed hyperlipidemia: Secondary | ICD-10-CM | POA: Diagnosis not present

## 2017-07-17 DIAGNOSIS — Z79899 Other long term (current) drug therapy: Secondary | ICD-10-CM | POA: Diagnosis not present

## 2017-07-17 DIAGNOSIS — E538 Deficiency of other specified B group vitamins: Secondary | ICD-10-CM | POA: Diagnosis not present

## 2017-07-17 DIAGNOSIS — Z0000 Encounter for general adult medical examination without abnormal findings: Secondary | ICD-10-CM | POA: Diagnosis not present

## 2017-09-08 ENCOUNTER — Ambulatory Visit
Admission: RE | Admit: 2017-09-08 | Discharge: 2017-09-08 | Disposition: A | Discharge status: Home / Self Care | Payer: PPO | Source: Ambulatory Visit | Attending: Internal Medicine | Admitting: Internal Medicine

## 2017-09-08 ENCOUNTER — Other Ambulatory Visit: Payer: Self-pay | Admitting: Internal Medicine

## 2017-09-08 DIAGNOSIS — I10 Essential (primary) hypertension: Secondary | ICD-10-CM | POA: Diagnosis not present

## 2017-09-08 DIAGNOSIS — H539 Unspecified visual disturbance: Secondary | ICD-10-CM

## 2017-09-08 DIAGNOSIS — I739 Peripheral vascular disease, unspecified: Secondary | ICD-10-CM | POA: Insufficient documentation

## 2017-09-08 DIAGNOSIS — I6523 Occlusion and stenosis of bilateral carotid arteries: Secondary | ICD-10-CM | POA: Insufficient documentation

## 2017-09-08 DIAGNOSIS — R5383 Other fatigue: Secondary | ICD-10-CM | POA: Diagnosis not present

## 2017-09-08 DIAGNOSIS — R51 Headache: Secondary | ICD-10-CM | POA: Diagnosis not present

## 2017-09-15 DIAGNOSIS — F5104 Psychophysiologic insomnia: Secondary | ICD-10-CM | POA: Diagnosis not present

## 2017-09-15 DIAGNOSIS — I10 Essential (primary) hypertension: Secondary | ICD-10-CM | POA: Diagnosis not present

## 2017-09-15 DIAGNOSIS — Z23 Encounter for immunization: Secondary | ICD-10-CM | POA: Diagnosis not present

## 2018-05-05 DIAGNOSIS — M5416 Radiculopathy, lumbar region: Secondary | ICD-10-CM | POA: Diagnosis not present

## 2018-05-05 DIAGNOSIS — M4124 Other idiopathic scoliosis, thoracic region: Secondary | ICD-10-CM | POA: Diagnosis not present

## 2018-05-05 DIAGNOSIS — M9903 Segmental and somatic dysfunction of lumbar region: Secondary | ICD-10-CM | POA: Diagnosis not present

## 2018-05-05 DIAGNOSIS — M9902 Segmental and somatic dysfunction of thoracic region: Secondary | ICD-10-CM | POA: Diagnosis not present

## 2018-05-07 DIAGNOSIS — M4124 Other idiopathic scoliosis, thoracic region: Secondary | ICD-10-CM | POA: Diagnosis not present

## 2018-05-07 DIAGNOSIS — M9902 Segmental and somatic dysfunction of thoracic region: Secondary | ICD-10-CM | POA: Diagnosis not present

## 2018-05-07 DIAGNOSIS — M9903 Segmental and somatic dysfunction of lumbar region: Secondary | ICD-10-CM | POA: Diagnosis not present

## 2018-05-07 DIAGNOSIS — M5416 Radiculopathy, lumbar region: Secondary | ICD-10-CM | POA: Diagnosis not present

## 2018-05-08 DIAGNOSIS — M5416 Radiculopathy, lumbar region: Secondary | ICD-10-CM | POA: Diagnosis not present

## 2018-05-08 DIAGNOSIS — M9903 Segmental and somatic dysfunction of lumbar region: Secondary | ICD-10-CM | POA: Diagnosis not present

## 2018-05-08 DIAGNOSIS — M9902 Segmental and somatic dysfunction of thoracic region: Secondary | ICD-10-CM | POA: Diagnosis not present

## 2018-05-08 DIAGNOSIS — M4124 Other idiopathic scoliosis, thoracic region: Secondary | ICD-10-CM | POA: Diagnosis not present

## 2018-05-11 DIAGNOSIS — M9902 Segmental and somatic dysfunction of thoracic region: Secondary | ICD-10-CM | POA: Diagnosis not present

## 2018-05-11 DIAGNOSIS — M4124 Other idiopathic scoliosis, thoracic region: Secondary | ICD-10-CM | POA: Diagnosis not present

## 2018-05-11 DIAGNOSIS — M5416 Radiculopathy, lumbar region: Secondary | ICD-10-CM | POA: Diagnosis not present

## 2018-05-11 DIAGNOSIS — M9903 Segmental and somatic dysfunction of lumbar region: Secondary | ICD-10-CM | POA: Diagnosis not present

## 2018-05-12 DIAGNOSIS — M5416 Radiculopathy, lumbar region: Secondary | ICD-10-CM | POA: Diagnosis not present

## 2018-05-12 DIAGNOSIS — M9902 Segmental and somatic dysfunction of thoracic region: Secondary | ICD-10-CM | POA: Diagnosis not present

## 2018-05-12 DIAGNOSIS — M9903 Segmental and somatic dysfunction of lumbar region: Secondary | ICD-10-CM | POA: Diagnosis not present

## 2018-05-12 DIAGNOSIS — M4124 Other idiopathic scoliosis, thoracic region: Secondary | ICD-10-CM | POA: Diagnosis not present

## 2018-05-13 DIAGNOSIS — M9903 Segmental and somatic dysfunction of lumbar region: Secondary | ICD-10-CM | POA: Diagnosis not present

## 2018-05-13 DIAGNOSIS — M5416 Radiculopathy, lumbar region: Secondary | ICD-10-CM | POA: Diagnosis not present

## 2018-05-13 DIAGNOSIS — M9902 Segmental and somatic dysfunction of thoracic region: Secondary | ICD-10-CM | POA: Diagnosis not present

## 2018-05-13 DIAGNOSIS — M4124 Other idiopathic scoliosis, thoracic region: Secondary | ICD-10-CM | POA: Diagnosis not present

## 2018-05-18 DIAGNOSIS — M4124 Other idiopathic scoliosis, thoracic region: Secondary | ICD-10-CM | POA: Diagnosis not present

## 2018-05-18 DIAGNOSIS — M9902 Segmental and somatic dysfunction of thoracic region: Secondary | ICD-10-CM | POA: Diagnosis not present

## 2018-05-18 DIAGNOSIS — M5416 Radiculopathy, lumbar region: Secondary | ICD-10-CM | POA: Diagnosis not present

## 2018-05-18 DIAGNOSIS — M9903 Segmental and somatic dysfunction of lumbar region: Secondary | ICD-10-CM | POA: Diagnosis not present

## 2018-05-20 DIAGNOSIS — M4124 Other idiopathic scoliosis, thoracic region: Secondary | ICD-10-CM | POA: Diagnosis not present

## 2018-05-20 DIAGNOSIS — M9903 Segmental and somatic dysfunction of lumbar region: Secondary | ICD-10-CM | POA: Diagnosis not present

## 2018-05-20 DIAGNOSIS — M5416 Radiculopathy, lumbar region: Secondary | ICD-10-CM | POA: Diagnosis not present

## 2018-05-20 DIAGNOSIS — M9902 Segmental and somatic dysfunction of thoracic region: Secondary | ICD-10-CM | POA: Diagnosis not present

## 2018-05-22 DIAGNOSIS — M4124 Other idiopathic scoliosis, thoracic region: Secondary | ICD-10-CM | POA: Diagnosis not present

## 2018-05-22 DIAGNOSIS — M9903 Segmental and somatic dysfunction of lumbar region: Secondary | ICD-10-CM | POA: Diagnosis not present

## 2018-05-22 DIAGNOSIS — M9902 Segmental and somatic dysfunction of thoracic region: Secondary | ICD-10-CM | POA: Diagnosis not present

## 2018-05-22 DIAGNOSIS — M5416 Radiculopathy, lumbar region: Secondary | ICD-10-CM | POA: Diagnosis not present

## 2018-05-25 DIAGNOSIS — M4124 Other idiopathic scoliosis, thoracic region: Secondary | ICD-10-CM | POA: Diagnosis not present

## 2018-05-25 DIAGNOSIS — M9903 Segmental and somatic dysfunction of lumbar region: Secondary | ICD-10-CM | POA: Diagnosis not present

## 2018-05-25 DIAGNOSIS — M5416 Radiculopathy, lumbar region: Secondary | ICD-10-CM | POA: Diagnosis not present

## 2018-05-25 DIAGNOSIS — M9902 Segmental and somatic dysfunction of thoracic region: Secondary | ICD-10-CM | POA: Diagnosis not present

## 2018-05-26 DIAGNOSIS — M5416 Radiculopathy, lumbar region: Secondary | ICD-10-CM | POA: Diagnosis not present

## 2018-05-26 DIAGNOSIS — M9902 Segmental and somatic dysfunction of thoracic region: Secondary | ICD-10-CM | POA: Diagnosis not present

## 2018-05-26 DIAGNOSIS — M9903 Segmental and somatic dysfunction of lumbar region: Secondary | ICD-10-CM | POA: Diagnosis not present

## 2018-05-26 DIAGNOSIS — M4124 Other idiopathic scoliosis, thoracic region: Secondary | ICD-10-CM | POA: Diagnosis not present

## 2018-06-01 DIAGNOSIS — M9903 Segmental and somatic dysfunction of lumbar region: Secondary | ICD-10-CM | POA: Diagnosis not present

## 2018-06-01 DIAGNOSIS — M4124 Other idiopathic scoliosis, thoracic region: Secondary | ICD-10-CM | POA: Diagnosis not present

## 2018-06-01 DIAGNOSIS — M9902 Segmental and somatic dysfunction of thoracic region: Secondary | ICD-10-CM | POA: Diagnosis not present

## 2018-06-01 DIAGNOSIS — M5416 Radiculopathy, lumbar region: Secondary | ICD-10-CM | POA: Diagnosis not present

## 2018-06-03 DIAGNOSIS — M9902 Segmental and somatic dysfunction of thoracic region: Secondary | ICD-10-CM | POA: Diagnosis not present

## 2018-06-03 DIAGNOSIS — M9903 Segmental and somatic dysfunction of lumbar region: Secondary | ICD-10-CM | POA: Diagnosis not present

## 2018-06-03 DIAGNOSIS — M4124 Other idiopathic scoliosis, thoracic region: Secondary | ICD-10-CM | POA: Diagnosis not present

## 2018-06-03 DIAGNOSIS — M5416 Radiculopathy, lumbar region: Secondary | ICD-10-CM | POA: Diagnosis not present

## 2018-06-08 DIAGNOSIS — M9903 Segmental and somatic dysfunction of lumbar region: Secondary | ICD-10-CM | POA: Diagnosis not present

## 2018-06-08 DIAGNOSIS — M4124 Other idiopathic scoliosis, thoracic region: Secondary | ICD-10-CM | POA: Diagnosis not present

## 2018-06-08 DIAGNOSIS — M9902 Segmental and somatic dysfunction of thoracic region: Secondary | ICD-10-CM | POA: Diagnosis not present

## 2018-06-08 DIAGNOSIS — M5416 Radiculopathy, lumbar region: Secondary | ICD-10-CM | POA: Diagnosis not present

## 2018-06-10 DIAGNOSIS — M4124 Other idiopathic scoliosis, thoracic region: Secondary | ICD-10-CM | POA: Diagnosis not present

## 2018-06-10 DIAGNOSIS — M9902 Segmental and somatic dysfunction of thoracic region: Secondary | ICD-10-CM | POA: Diagnosis not present

## 2018-06-10 DIAGNOSIS — M9903 Segmental and somatic dysfunction of lumbar region: Secondary | ICD-10-CM | POA: Diagnosis not present

## 2018-06-10 DIAGNOSIS — M5416 Radiculopathy, lumbar region: Secondary | ICD-10-CM | POA: Diagnosis not present

## 2018-06-15 DIAGNOSIS — M5416 Radiculopathy, lumbar region: Secondary | ICD-10-CM | POA: Diagnosis not present

## 2018-06-15 DIAGNOSIS — M9903 Segmental and somatic dysfunction of lumbar region: Secondary | ICD-10-CM | POA: Diagnosis not present

## 2018-06-15 DIAGNOSIS — M4124 Other idiopathic scoliosis, thoracic region: Secondary | ICD-10-CM | POA: Diagnosis not present

## 2018-06-15 DIAGNOSIS — M9902 Segmental and somatic dysfunction of thoracic region: Secondary | ICD-10-CM | POA: Diagnosis not present

## 2018-06-17 DIAGNOSIS — M9902 Segmental and somatic dysfunction of thoracic region: Secondary | ICD-10-CM | POA: Diagnosis not present

## 2018-06-17 DIAGNOSIS — M4124 Other idiopathic scoliosis, thoracic region: Secondary | ICD-10-CM | POA: Diagnosis not present

## 2018-06-17 DIAGNOSIS — M5416 Radiculopathy, lumbar region: Secondary | ICD-10-CM | POA: Diagnosis not present

## 2018-06-17 DIAGNOSIS — M9903 Segmental and somatic dysfunction of lumbar region: Secondary | ICD-10-CM | POA: Diagnosis not present

## 2018-06-23 DIAGNOSIS — M9903 Segmental and somatic dysfunction of lumbar region: Secondary | ICD-10-CM | POA: Diagnosis not present

## 2018-06-23 DIAGNOSIS — M5416 Radiculopathy, lumbar region: Secondary | ICD-10-CM | POA: Diagnosis not present

## 2018-06-23 DIAGNOSIS — M4124 Other idiopathic scoliosis, thoracic region: Secondary | ICD-10-CM | POA: Diagnosis not present

## 2018-06-23 DIAGNOSIS — M9902 Segmental and somatic dysfunction of thoracic region: Secondary | ICD-10-CM | POA: Diagnosis not present

## 2018-06-30 DIAGNOSIS — M9903 Segmental and somatic dysfunction of lumbar region: Secondary | ICD-10-CM | POA: Diagnosis not present

## 2018-06-30 DIAGNOSIS — M5416 Radiculopathy, lumbar region: Secondary | ICD-10-CM | POA: Diagnosis not present

## 2018-06-30 DIAGNOSIS — M9902 Segmental and somatic dysfunction of thoracic region: Secondary | ICD-10-CM | POA: Diagnosis not present

## 2018-06-30 DIAGNOSIS — M4124 Other idiopathic scoliosis, thoracic region: Secondary | ICD-10-CM | POA: Diagnosis not present

## 2018-07-07 DIAGNOSIS — M5416 Radiculopathy, lumbar region: Secondary | ICD-10-CM | POA: Diagnosis not present

## 2018-07-07 DIAGNOSIS — M4124 Other idiopathic scoliosis, thoracic region: Secondary | ICD-10-CM | POA: Diagnosis not present

## 2018-07-07 DIAGNOSIS — M9903 Segmental and somatic dysfunction of lumbar region: Secondary | ICD-10-CM | POA: Diagnosis not present

## 2018-07-07 DIAGNOSIS — M9902 Segmental and somatic dysfunction of thoracic region: Secondary | ICD-10-CM | POA: Diagnosis not present

## 2018-07-14 DIAGNOSIS — Z79899 Other long term (current) drug therapy: Secondary | ICD-10-CM | POA: Diagnosis not present

## 2018-07-14 DIAGNOSIS — E782 Mixed hyperlipidemia: Secondary | ICD-10-CM | POA: Diagnosis not present

## 2018-07-14 DIAGNOSIS — E538 Deficiency of other specified B group vitamins: Secondary | ICD-10-CM | POA: Diagnosis not present

## 2018-07-20 DIAGNOSIS — E538 Deficiency of other specified B group vitamins: Secondary | ICD-10-CM | POA: Diagnosis not present

## 2018-07-20 DIAGNOSIS — E782 Mixed hyperlipidemia: Secondary | ICD-10-CM | POA: Diagnosis not present

## 2018-07-20 DIAGNOSIS — Z0000 Encounter for general adult medical examination without abnormal findings: Secondary | ICD-10-CM | POA: Diagnosis not present

## 2018-07-20 DIAGNOSIS — Z79899 Other long term (current) drug therapy: Secondary | ICD-10-CM | POA: Diagnosis not present

## 2018-07-21 DIAGNOSIS — M9903 Segmental and somatic dysfunction of lumbar region: Secondary | ICD-10-CM | POA: Diagnosis not present

## 2018-07-21 DIAGNOSIS — M4124 Other idiopathic scoliosis, thoracic region: Secondary | ICD-10-CM | POA: Diagnosis not present

## 2018-07-21 DIAGNOSIS — M9902 Segmental and somatic dysfunction of thoracic region: Secondary | ICD-10-CM | POA: Diagnosis not present

## 2018-07-21 DIAGNOSIS — M5416 Radiculopathy, lumbar region: Secondary | ICD-10-CM | POA: Diagnosis not present

## 2018-07-22 DIAGNOSIS — M9903 Segmental and somatic dysfunction of lumbar region: Secondary | ICD-10-CM | POA: Diagnosis not present

## 2018-07-22 DIAGNOSIS — M4124 Other idiopathic scoliosis, thoracic region: Secondary | ICD-10-CM | POA: Diagnosis not present

## 2018-07-22 DIAGNOSIS — M9902 Segmental and somatic dysfunction of thoracic region: Secondary | ICD-10-CM | POA: Diagnosis not present

## 2018-07-22 DIAGNOSIS — M5416 Radiculopathy, lumbar region: Secondary | ICD-10-CM | POA: Diagnosis not present

## 2018-07-24 DIAGNOSIS — M9902 Segmental and somatic dysfunction of thoracic region: Secondary | ICD-10-CM | POA: Diagnosis not present

## 2018-07-24 DIAGNOSIS — M9903 Segmental and somatic dysfunction of lumbar region: Secondary | ICD-10-CM | POA: Diagnosis not present

## 2018-07-24 DIAGNOSIS — M4124 Other idiopathic scoliosis, thoracic region: Secondary | ICD-10-CM | POA: Diagnosis not present

## 2018-07-24 DIAGNOSIS — M5416 Radiculopathy, lumbar region: Secondary | ICD-10-CM | POA: Diagnosis not present

## 2018-07-29 DIAGNOSIS — M9903 Segmental and somatic dysfunction of lumbar region: Secondary | ICD-10-CM | POA: Diagnosis not present

## 2018-07-29 DIAGNOSIS — M4124 Other idiopathic scoliosis, thoracic region: Secondary | ICD-10-CM | POA: Diagnosis not present

## 2018-07-29 DIAGNOSIS — M9902 Segmental and somatic dysfunction of thoracic region: Secondary | ICD-10-CM | POA: Diagnosis not present

## 2018-07-29 DIAGNOSIS — M5416 Radiculopathy, lumbar region: Secondary | ICD-10-CM | POA: Diagnosis not present

## 2018-08-03 DIAGNOSIS — M9903 Segmental and somatic dysfunction of lumbar region: Secondary | ICD-10-CM | POA: Diagnosis not present

## 2018-08-03 DIAGNOSIS — M5416 Radiculopathy, lumbar region: Secondary | ICD-10-CM | POA: Diagnosis not present

## 2018-08-03 DIAGNOSIS — M4124 Other idiopathic scoliosis, thoracic region: Secondary | ICD-10-CM | POA: Diagnosis not present

## 2018-08-03 DIAGNOSIS — M9902 Segmental and somatic dysfunction of thoracic region: Secondary | ICD-10-CM | POA: Diagnosis not present

## 2018-08-10 DIAGNOSIS — Z79899 Other long term (current) drug therapy: Secondary | ICD-10-CM | POA: Diagnosis not present

## 2018-08-10 DIAGNOSIS — Z23 Encounter for immunization: Secondary | ICD-10-CM | POA: Diagnosis not present

## 2018-08-10 DIAGNOSIS — I10 Essential (primary) hypertension: Secondary | ICD-10-CM | POA: Diagnosis not present

## 2018-08-17 DIAGNOSIS — M5416 Radiculopathy, lumbar region: Secondary | ICD-10-CM | POA: Diagnosis not present

## 2018-08-17 DIAGNOSIS — M9903 Segmental and somatic dysfunction of lumbar region: Secondary | ICD-10-CM | POA: Diagnosis not present

## 2018-08-17 DIAGNOSIS — M9902 Segmental and somatic dysfunction of thoracic region: Secondary | ICD-10-CM | POA: Diagnosis not present

## 2018-08-17 DIAGNOSIS — M4124 Other idiopathic scoliosis, thoracic region: Secondary | ICD-10-CM | POA: Diagnosis not present

## 2018-08-24 DIAGNOSIS — M5416 Radiculopathy, lumbar region: Secondary | ICD-10-CM | POA: Diagnosis not present

## 2018-08-24 DIAGNOSIS — M9903 Segmental and somatic dysfunction of lumbar region: Secondary | ICD-10-CM | POA: Diagnosis not present

## 2018-08-24 DIAGNOSIS — M4124 Other idiopathic scoliosis, thoracic region: Secondary | ICD-10-CM | POA: Diagnosis not present

## 2018-08-24 DIAGNOSIS — M9902 Segmental and somatic dysfunction of thoracic region: Secondary | ICD-10-CM | POA: Diagnosis not present

## 2018-08-31 DIAGNOSIS — M4124 Other idiopathic scoliosis, thoracic region: Secondary | ICD-10-CM | POA: Diagnosis not present

## 2018-08-31 DIAGNOSIS — M5416 Radiculopathy, lumbar region: Secondary | ICD-10-CM | POA: Diagnosis not present

## 2018-08-31 DIAGNOSIS — M9903 Segmental and somatic dysfunction of lumbar region: Secondary | ICD-10-CM | POA: Diagnosis not present

## 2018-08-31 DIAGNOSIS — M9902 Segmental and somatic dysfunction of thoracic region: Secondary | ICD-10-CM | POA: Diagnosis not present

## 2018-10-05 DIAGNOSIS — M5416 Radiculopathy, lumbar region: Secondary | ICD-10-CM | POA: Diagnosis not present

## 2018-10-05 DIAGNOSIS — M4124 Other idiopathic scoliosis, thoracic region: Secondary | ICD-10-CM | POA: Diagnosis not present

## 2018-10-05 DIAGNOSIS — M9903 Segmental and somatic dysfunction of lumbar region: Secondary | ICD-10-CM | POA: Diagnosis not present

## 2018-10-05 DIAGNOSIS — M9902 Segmental and somatic dysfunction of thoracic region: Secondary | ICD-10-CM | POA: Diagnosis not present

## 2018-11-09 DIAGNOSIS — L03031 Cellulitis of right toe: Secondary | ICD-10-CM | POA: Diagnosis not present

## 2018-11-10 ENCOUNTER — Encounter: Payer: Self-pay | Admitting: Emergency Medicine

## 2018-11-10 ENCOUNTER — Inpatient Hospital Stay
Admission: EM | Admit: 2018-11-10 | Discharge: 2018-11-12 | DRG: 392 | Disposition: A | Discharge status: Home / Self Care | Payer: PPO | Attending: Internal Medicine | Admitting: Internal Medicine

## 2018-11-10 ENCOUNTER — Other Ambulatory Visit: Payer: Self-pay

## 2018-11-10 ENCOUNTER — Emergency Department: Payer: PPO

## 2018-11-10 DIAGNOSIS — K219 Gastro-esophageal reflux disease without esophagitis: Secondary | ICD-10-CM | POA: Diagnosis present

## 2018-11-10 DIAGNOSIS — Z87891 Personal history of nicotine dependence: Secondary | ICD-10-CM

## 2018-11-10 DIAGNOSIS — L0291 Cutaneous abscess, unspecified: Secondary | ICD-10-CM | POA: Diagnosis not present

## 2018-11-10 DIAGNOSIS — R159 Full incontinence of feces: Secondary | ICD-10-CM | POA: Diagnosis not present

## 2018-11-10 DIAGNOSIS — K529 Noninfective gastroenteritis and colitis, unspecified: Secondary | ICD-10-CM | POA: Diagnosis present

## 2018-11-10 DIAGNOSIS — K5792 Diverticulitis of intestine, part unspecified, without perforation or abscess without bleeding: Secondary | ICD-10-CM | POA: Diagnosis present

## 2018-11-10 DIAGNOSIS — I10 Essential (primary) hypertension: Secondary | ICD-10-CM | POA: Diagnosis not present

## 2018-11-10 DIAGNOSIS — Z79899 Other long term (current) drug therapy: Secondary | ICD-10-CM | POA: Diagnosis not present

## 2018-11-10 DIAGNOSIS — Z90710 Acquired absence of both cervix and uterus: Secondary | ICD-10-CM

## 2018-11-10 DIAGNOSIS — K5732 Diverticulitis of large intestine without perforation or abscess without bleeding: Principal | ICD-10-CM | POA: Diagnosis present

## 2018-11-10 DIAGNOSIS — Z888 Allergy status to other drugs, medicaments and biological substances status: Secondary | ICD-10-CM

## 2018-11-10 DIAGNOSIS — Z23 Encounter for immunization: Secondary | ICD-10-CM | POA: Diagnosis not present

## 2018-11-10 DIAGNOSIS — Z79891 Long term (current) use of opiate analgesic: Secondary | ICD-10-CM | POA: Diagnosis not present

## 2018-11-10 DIAGNOSIS — J449 Chronic obstructive pulmonary disease, unspecified: Secondary | ICD-10-CM | POA: Diagnosis present

## 2018-11-10 DIAGNOSIS — Z885 Allergy status to narcotic agent status: Secondary | ICD-10-CM

## 2018-11-10 DIAGNOSIS — R109 Unspecified abdominal pain: Secondary | ICD-10-CM | POA: Diagnosis present

## 2018-11-10 DIAGNOSIS — Z8719 Personal history of other diseases of the digestive system: Secondary | ICD-10-CM

## 2018-11-10 LAB — URINALYSIS, COMPLETE (UACMP) WITH MICROSCOPIC
BACTERIA UA: NONE SEEN
Bilirubin Urine: NEGATIVE
GLUCOSE, UA: NEGATIVE mg/dL
KETONES UR: NEGATIVE mg/dL
NITRITE: NEGATIVE
PH: 6 (ref 5.0–8.0)
Protein, ur: NEGATIVE mg/dL
Specific Gravity, Urine: 1.013 (ref 1.005–1.030)

## 2018-11-10 LAB — COMPREHENSIVE METABOLIC PANEL
ALT: 12 U/L (ref 0–44)
AST: 18 U/L (ref 15–41)
Albumin: 3.9 g/dL (ref 3.5–5.0)
Alkaline Phosphatase: 64 U/L (ref 38–126)
Anion gap: 9 (ref 5–15)
BUN: 22 mg/dL (ref 8–23)
CHLORIDE: 97 mmol/L — AB (ref 98–111)
CO2: 30 mmol/L (ref 22–32)
Calcium: 9 mg/dL (ref 8.9–10.3)
Creatinine, Ser: 0.83 mg/dL (ref 0.44–1.00)
Glucose, Bld: 102 mg/dL — ABNORMAL HIGH (ref 70–99)
POTASSIUM: 3.3 mmol/L — AB (ref 3.5–5.1)
SODIUM: 136 mmol/L (ref 135–145)
Total Bilirubin: 0.5 mg/dL (ref 0.3–1.2)
Total Protein: 7.4 g/dL (ref 6.5–8.1)

## 2018-11-10 LAB — CBC
HEMATOCRIT: 39.6 % (ref 36.0–46.0)
HEMOGLOBIN: 12.9 g/dL (ref 12.0–15.0)
MCH: 30.6 pg (ref 26.0–34.0)
MCHC: 32.6 g/dL (ref 30.0–36.0)
MCV: 94.1 fL (ref 80.0–100.0)
NRBC: 0 % (ref 0.0–0.2)
Platelets: 228 10*3/uL (ref 150–400)
RBC: 4.21 MIL/uL (ref 3.87–5.11)
RDW: 11.8 % (ref 11.5–15.5)
WBC: 10 10*3/uL (ref 4.0–10.5)

## 2018-11-10 LAB — LIPASE, BLOOD: LIPASE: 25 U/L (ref 11–51)

## 2018-11-10 MED ORDER — ENOXAPARIN SODIUM 40 MG/0.4ML ~~LOC~~ SOLN
40.0000 mg | SUBCUTANEOUS | Status: DC
  Administered 2018-11-10 – 2018-11-11 (×2): 40 mg via SUBCUTANEOUS
  Filled 2018-11-10 (×2): qty 0.4

## 2018-11-10 MED ORDER — IOHEXOL 300 MG/ML  SOLN
75.0000 mL | Freq: Once | INTRAMUSCULAR | Status: AC | PRN
  Administered 2018-11-10: 75 mL via INTRAVENOUS

## 2018-11-10 MED ORDER — ACETAMINOPHEN 325 MG PO TABS
650.0000 mg | ORAL_TABLET | Freq: Four times a day (QID) | ORAL | Status: DC | PRN
  Administered 2018-11-11: 650 mg via ORAL
  Filled 2018-11-10: qty 2

## 2018-11-10 MED ORDER — CHLORTHALIDONE 25 MG PO TABS
25.0000 mg | ORAL_TABLET | Freq: Every day | ORAL | Status: DC
  Administered 2018-11-11 – 2018-11-12 (×2): 25 mg via ORAL
  Filled 2018-11-10 (×2): qty 1

## 2018-11-10 MED ORDER — TRAMADOL HCL 50 MG PO TABS: 50.0000 mg | ORAL_TABLET | Freq: Four times a day (QID) | ORAL | Status: DC | PRN

## 2018-11-10 MED ORDER — ACETAMINOPHEN 325 MG PO TABS: 650.0000 mg | ORAL_TABLET | Freq: Four times a day (QID) | ORAL | Status: DC | PRN

## 2018-11-10 MED ORDER — ONDANSETRON HCL 4 MG PO TABS: 4.0000 mg | ORAL_TABLET | Freq: Four times a day (QID) | ORAL | Status: DC | PRN

## 2018-11-10 MED ORDER — ATENOLOL 50 MG PO TABS
50.0000 mg | ORAL_TABLET | Freq: Every day | ORAL | Status: DC
  Administered 2018-11-11 – 2018-11-12 (×2): 50 mg via ORAL
  Filled 2018-11-10 (×2): qty 1

## 2018-11-10 MED ORDER — PNEUMOCOCCAL VAC POLYVALENT 25 MCG/0.5ML IJ INJ
0.5000 mL | INJECTION | INTRAMUSCULAR | Status: AC
  Administered 2018-11-11: 0.5 mL via INTRAMUSCULAR
  Filled 2018-11-10: qty 0.5

## 2018-11-10 MED ORDER — MECLIZINE HCL 25 MG PO TABS
25.0000 mg | ORAL_TABLET | Freq: Three times a day (TID) | ORAL | Status: DC | PRN
  Filled 2018-11-10: qty 1

## 2018-11-10 MED ORDER — ATENOLOL-CHLORTHALIDONE 50-25 MG PO TABS: 1.0000 | ORAL_TABLET | Freq: Every day | ORAL | Status: DC

## 2018-11-10 MED ORDER — POTASSIUM CHLORIDE CRYS ER 20 MEQ PO TBCR
40.0000 meq | EXTENDED_RELEASE_TABLET | Freq: Once | ORAL | Status: AC
  Administered 2018-11-10: 40 meq via ORAL
  Filled 2018-11-10: qty 2

## 2018-11-10 MED ORDER — METRONIDAZOLE IN NACL 5-0.79 MG/ML-% IV SOLN
500.0000 mg | Freq: Once | INTRAVENOUS | Status: AC
  Administered 2018-11-10: 500 mg via INTRAVENOUS
  Filled 2018-11-10: qty 100

## 2018-11-10 MED ORDER — SODIUM CHLORIDE 0.9 % IV SOLN
3.0000 g | Freq: Four times a day (QID) | INTRAVENOUS | Status: DC
  Administered 2018-11-10 – 2018-11-12 (×7): 3 g via INTRAVENOUS
  Filled 2018-11-10 (×10): qty 3

## 2018-11-10 MED ORDER — ACETAMINOPHEN 650 MG RE SUPP: 650.0000 mg | Freq: Four times a day (QID) | RECTAL | Status: DC | PRN

## 2018-11-10 MED ORDER — CIPROFLOXACIN IN D5W 400 MG/200ML IV SOLN
400.0000 mg | Freq: Once | INTRAVENOUS | Status: AC
  Administered 2018-11-10: 400 mg via INTRAVENOUS
  Filled 2018-11-10: qty 200

## 2018-11-10 MED ORDER — IOPAMIDOL (ISOVUE-300) INJECTION 61%
30.0000 mL | Freq: Once | INTRAVENOUS | Status: AC | PRN
Start: 2018-11-10 — End: 2018-11-10
  Administered 2018-11-10: 30 mL via ORAL

## 2018-11-10 MED ORDER — SODIUM CHLORIDE 0.9 % IV SOLN
INTRAVENOUS | Status: DC
  Administered 2018-11-10 – 2018-11-11 (×2): via INTRAVENOUS

## 2018-11-10 MED ORDER — PANTOPRAZOLE SODIUM 40 MG PO TBEC
40.0000 mg | DELAYED_RELEASE_TABLET | Freq: Every day | ORAL | Status: DC
  Administered 2018-11-11 – 2018-11-12 (×2): 40 mg via ORAL
  Filled 2018-11-10 (×2): qty 1

## 2018-11-10 MED ORDER — ONDANSETRON HCL 4 MG/2ML IJ SOLN: 4.0000 mg | Freq: Four times a day (QID) | INTRAMUSCULAR | Status: DC | PRN

## 2018-11-10 NOTE — ED Notes (Signed)
Report given to receiving nurse. Patient to floor via stretcher.

## 2018-11-10 NOTE — ED Notes (Signed)
Patient off unit to CT

## 2018-11-10 NOTE — ED Triage Notes (Signed)
Patient reports for the last 2-3 years she has had intermittent episodes of diarrhea. States her rectal sphincter does not close completely so she is not able to hold in stool. Patient has been followed by PCP for same with no definite answers. States for the last 2 days she has also had increasing abdominal pain and back pain which is what prompted her to come.

## 2018-11-10 NOTE — ED Notes (Signed)
MRI on phone to screen patient. Lunch box provided to patient and family members.

## 2018-11-10 NOTE — ED Notes (Signed)
ED TO INPATIENT HANDOFF REPORT  Name/Age/Gender Karina Stone 77 y.o. female  Code Status   Home/SNF/Other From home   Chief Complaint abd and back pain diarrhea  Level of Care/Admitting Diagnosis ED Disposition    ED Disposition Condition [place]: [place] [[id number]]  Level of Care: Med-Surg [16]  Diagnosis: Acute diverticulitis [[id number]]  Admitting Physician: Dustin Flock [[id number]]  Attending Physician: Dustin Flock [[id number]]  Estimated length of stay: past midnight tomorrow  Certification:: I certify this patient will need inpatient services for at least 2 midnights  PT Class (Do Not Modify): Inpatient [101]  PT Acc Code (Do Not Modify): Private [1]       Medical History Past Medical History:  Diagnosis Date  . Arthritis    finger  . Chronic diarrhea   . Colon polyps   . COPD (chronic obstructive pulmonary disease) ([place])   . Diverticulosis   . Dyspnea   . GERD (gastroesophageal reflux disease)   . Headache   . Hypertension   . Thoracic scoliosis   . Vertigo   . Wears dentures    full upper and lower    Allergies Allergies  Allergen Reactions  . Codeine     headache  . Amitriptyline Palpitations  . Norvasc [Amlodipine Besylate] Palpitations    IV Location/Drains/Wounds Patient Lines/Drains/Airways Status   Active Line/Drains/Airways    Name:   Placement date:   Placement time:   Site:   Days:   Peripheral IV 11/10/18 Right Antecubital   11/10/18    1301    Antecubital   less than 1   Incision (Closed) 02/11/17 Eye Other (Comment)   02/11/17    0913     637          Labs/Imaging Results for orders placed or performed during the hospital encounter of 11/10/18 (from the past 48 hour(s))  Lipase, blood     Status: None   Collection Time: 11/10/18 10:18 AM  Result Value Ref Range   Lipase 25 11 - 51 U/L    Comment: Performed at [facility], [address] [place], [place] [address]   Comprehensive metabolic panel     Status: Abnormal   Collection Time: 11/10/18 10:18 AM  Result Value Ref Range   Sodium 136 135 - 145 mmol/L   Potassium 3.3 (L) 3.5 - 5.1 mmol/L   Chloride 97 (L) 98 - 111 mmol/L   CO2 30 22 - 32 mmol/L   Glucose, Bld 102 (H) 70 - 99 mg/dL   BUN 22 8 - 23 mg/dL   Creatinine, Ser 0.83 0.44 - 1.00 mg/dL   Calcium 9.0 8.9 - 10.3 mg/dL   Total Protein 7.4 6.5 - 8.1 g/dL   Albumin 3.9 3.5 - 5.0 g/dL   AST 18 15 - 41 U/L   ALT 12 0 - 44 U/L   Alkaline Phosphatase 64 38 - 126 U/L   Total Bilirubin 0.5 0.3 - 1.2 mg/dL   GFR calc non Af Amer >60 >60 mL/min   GFR calc Af Amer >60 >60 mL/min   Anion gap 9 5 - 15    Comment: Performed at [facility], [address] [place], [place] [address]  CBC     Status: None   Collection Time: 11/10/18 10:18 AM  Result Value Ref Range   WBC 10.0 4.0 - 10.5 K/uL   RBC 4.21 3.87 - 5.11 MIL/uL   Hemoglobin 12.9 12.0 -  15.0 g/dL   HCT 39.6 36.0 - 46.0 %   MCV 94.1 80.0 - 100.0 fL   MCH 30.6 26.0 - 34.0 pg   MCHC 32.6 30.0 - 36.0 g/dL   RDW 11.8 11.5 - 15.5 %   Platelets 228 150 - 400 K/uL   nRBC 0.0 0.0 - 0.2 %    Comment: Performed at [facility], [address] [place], [place] [address]  Urinalysis, Complete w Microscopic     Status: Abnormal   Collection Time: 11/10/18 10:18 AM  Result Value Ref Range   Color, Urine YELLOW (A) YELLOW   APPearance CLEAR (A) CLEAR   Specific Gravity, Urine 1.013 1.005 - 1.030   pH 6.0 5.0 - 8.0   Glucose, UA NEGATIVE NEGATIVE mg/dL   Hgb urine dipstick MODERATE (A) NEGATIVE   Bilirubin Urine NEGATIVE NEGATIVE   Ketones, ur NEGATIVE NEGATIVE mg/dL   Protein, ur NEGATIVE NEGATIVE mg/dL   Nitrite NEGATIVE NEGATIVE   Leukocytes, UA LARGE (A) NEGATIVE   RBC / HPF 0-5 0 - 5 RBC/hpf   WBC, UA 11-20 0 - 5 WBC/hpf   Bacteria, UA NONE SEEN NONE SEEN   Squamous Epithelial / LPF 6-10 0 - 5   Mucus PRESENT    Hyaline Casts, UA PRESENT    Amorphous Crystal  PRESENT     Comment: Performed at [facility], [address] [place], [place] [address]   Ct Abdomen Pelvis W Contrast  Result Date: 11/10/2018 CLINICAL DATA:  Abdominal pain with diarrhea EXAM: CT ABDOMEN AND PELVIS WITH CONTRAST TECHNIQUE: Multidetector CT imaging of the abdomen and pelvis was performed using the standard protocol following bolus administration of intravenous contrast. CONTRAST:  66mL OMNIPAQUE IOHEXOL 300 MG/ML  SOLN COMPARISON:  None. FINDINGS: Lower chest: There is atelectatic change in left base region. There is no lung base consolidation. Hepatobiliary: The liver contour is subtly lobular. No focal liver lesions are evident. Gallbladder wall is not appreciably thickened. There is no biliary duct dilatation. Pancreas: No pancreatic mass or inflammatory focus. Spleen: They were is a calcified granuloma in the spleen. Spleen otherwise appears unremarkable. Adrenals/Urinary Tract: Right adrenal appears normal. There is a x 8 mm nodular opacity in the left adrenal which has attenuation value in the indeterminate range for an adrenal mass. Kidneys bilaterally show no evident mass or hydronephrosis on either side. There is an extrarenal pelvis on each side, an anatomic variant. There is no renal or ureteral calculus on either side. Urinary bladder is midline with wall thickness within normal limits. Stomach/Bowel: There is extensive wall thickening in the distal sigmoid colon with several irregular diverticula. There is perinephric stranding and fluid between the diverticulum and urinary bladder. There is questionable early phlegmon development between the urinary bladder an the area of sigmoid diverticulitis. This questionable early phlegmon measures 2.6 x 1.8 cm. There is no well-defined abscess. No perforation is appreciable in this area. Elsewhere, there is no appreciable bowel wall or mesenteric thickening. There is no evident bowel obstruction. There is no appreciable free  air or portal venous air. There is moderate stool in the colon. Vascular/Lymphatic: There is extensive aortoiliac atherosclerosis. There is subtotal occlusion at the aortic bifurcation and origins of each internal carotid artery. No aneurysm. There is calcification in the major mesenteric arterial vessels without frank major mesenteric arterial vascular occlusion. There is no evident adenopathy in the abdomen or pelvis. Reproductive: Uterus is absent.  No pelvic mass is appreciable. Other: There is postoperative change  in the anterior abdominal wall. No hernia present in this area. There is no periappendiceal region inflammation. Question absence of the appendix. No abscess or ascites is appreciable in the abdomen or pelvis. Musculoskeletal: There is anterior wedging of the T11 vertebral body. There is multilevel arthropathy in the lumbar spine. There is mid lumbar levoscoliosis. There are no blastic or lytic bone lesions. There is no intramuscular or abdominal wall lesion. IMPRESSION: 1. Diverticulitis involving the distal sigmoid colon with irregular diverticula and wall thickening in this region. There is extensive stranding surrounding the distal sigmoid colon. There is questionable early phlegmon development between the area of diverticulitis in the distal sigmoid colon in the posterior bladder. Note that the bladder wall is not thickened in this area. No frank abscess. No perforation evident. 2. No evident bowel obstruction. No abscess elsewhere in the abdomen or pelvis. No periappendiceal region inflammation. Question previous appendectomy. 3. Extensive aortoiliac atherosclerosis with subtotal occlusion at the bifurcation and origins of the iliac arteries. 4. Subtle lobular contour of the liver. Question a degree of underlying hepatic cirrhosis. 5.  No renal or ureteral calculus.  No hydronephrosis. 6.  Calcified splenic granuloma noted. 7.  Uterus absent. Aortic Atherosclerosis (ICD10-I70.0). Electronically  Signed   By: Lowella Grip III M.D.   On: 11/10/2018 15:39    Pending Labs Unresulted Labs (From admission, onward)    Start     Ordered   11/10/18 1818  Osmolality, stool  Once,   STAT     11/10/18 1817   Signed and Held  CBC  (enoxaparin (LOVENOX)    CrCl >/= 30 ml/min)  Once,   R    Comments:  Baseline for enoxaparin therapy IF NOT ALREADY DRAWN.  Notify MD if PLT < 100 K.    Signed and Held   Signed and Held  Creatinine, serum  (enoxaparin (LOVENOX)    CrCl >/= 30 ml/min)  Once,   R    Comments:  Baseline for enoxaparin therapy IF NOT ALREADY DRAWN.    Signed and Held   Signed and Held  Creatinine, serum  (enoxaparin (LOVENOX)    CrCl >/= 30 ml/min)  Weekly,   R    Comments:  while on enoxaparin therapy    Signed and Held   Signed and Held  CBC  Tomorrow morning,   R     Signed and Held   Signed and Held  Basic metabolic panel  Tomorrow morning,   R     Signed and Held          Vitals/Pain Today's Vitals   11/10/18 1302 11/10/18 1619 11/10/18 1630 11/10/18 1859  BP: (!) 150/90 122/74 118/73 128/64  Pulse: 66 67  66  Resp: 18 18  18   Temp: 98.3 F (36.8 C) 98.6 F (37 C)  98.4 F (36.9 C)  TempSrc: Oral Oral  Oral  SpO2: 96% 99%  99%  Weight:      Height:      PainSc: 0-No pain 0-No pain  0-No pain    Isolation Precautions No active isolations  Medications Medications  Ampicillin-Sulbactam (UNASYN) 3 g in sodium chloride 0.9 % 100 mL IVPB (0 g Intravenous Stopped 11/10/18 1940)  iopamidol (ISOVUE-300) 61 % injection 30 mL (30 mLs Oral Contrast Given 11/10/18 1341)  iohexol (OMNIPAQUE) 300 MG/ML solution 75 mL (75 mLs Intravenous Contrast Given 11/10/18 1517)  ciprofloxacin (CIPRO) IVPB 400 mg (0 mg Intravenous Stopped 11/10/18 1850)  metroNIDAZOLE (FLAGYL) IVPB 500 mg (0  mg Intravenous Stopped 11/10/18 1838)  potassium chloride SA (K-DUR,KLOR-CON) CR tablet 40 mEq (40 mEq Oral Given 11/10/18 1856)    Mobility ambulatory

## 2018-11-10 NOTE — ED Notes (Signed)
Report having abd movement with increased episodes of diarrhea. Reports flank pain to left side. md at bedside to assess patient. Awaiting urine specimen.

## 2018-11-10 NOTE — ED Provider Notes (Signed)
[facility] Emergency Department Provider Note   ____________________________________________   First MD Initiated Contact with Patient 11/10/18 1205     (approximate)  I have reviewed the triage vital signs and the nursing notes.   HISTORY  Chief Complaint Abdominal Pain and Diarrhea    HP Karina Stone is a 77 y.o. female who reports 5 years of gradually worsening diarrhea.  She reports the diarrhea comes without her knowing it is happening.  She can feel it coming.  She says she has a loose rectal sphincter and just leaks poop.  This is been getting more frequent.  The last 2 or 3 days she has been having belly pain is and back pain especially at night and it is very bothersome wakes her up and she needs to get up and move before go away.  She reports her primary care doctor is checked out her stool to see if there was any infection and has not found any.   Past Medical History:  Diagnosis Date  . Arthritis    finger  . Chronic diarrhea   . Colon polyps   . COPD (chronic obstructive pulmonary disease) ([place])   . Diverticulosis   . Dyspnea   . GERD (gastroesophageal reflux disease)   . Headache   . Hypertension   . Thoracic scoliosis   . Vertigo   . Wears dentures    full upper and lower    Patient Active Problem List   Diagnosis Date Noted  . Acute diverticulitis 11/10/2018    Past Surgical History:  Procedure Laterality Date  . ABDOMINAL HYSTERECTOMY    . BROW LIFT Bilateral 02/11/2017   Procedure: bilateral secondary closure surgical wound dehisence extensive;  Surgeon: Karle Starch, MD;  Location: [facility];  Service: Ophthalmology;  Laterality: Bilateral;  . HERNIA REPAIR      Prior to Admission medications   Medication Sig Start Date End Date Taking? Authorizing Provider  acetaminophen (TYLENOL) 325 MG tablet Take 650 mg by mouth every 6 (six) hours as needed for mild pain or moderate pain.    Yes [provider]    atenolol-chlorthalidone (TENORETIC 50) 50-25 MG tablet Take 1 tablet by mouth daily.   Yes [provider]  cholecalciferol (VITAMIN D) 1000 units tablet Take 2,000 Units by mouth daily.   Yes [provider]  meclizine (ANTIVERT) 25 MG tablet Take 25 mg by mouth 3 (three) times daily as needed for dizziness.   Yes [provider]  omeprazole (PRILOSEC) 20 MG capsule Take 20 mg by mouth daily.   Yes [provider]  traMADol (ULTRAM) 50 MG tablet Take 50 mg by mouth 2 (two) times daily as needed for moderate pain.    Yes [provider]  vitamin B-12 (CYANOCOBALAMIN) 1000 MCG tablet Take 1,000 mcg by mouth daily.   Yes [provider]    Allergies Codeine; Amitriptyline; and Norvasc [amlodipine besylate]  Family History  Problem Relation Age of Onset  . Breast cancer Neg Hx     Social History Social History   Tobacco Use  . Smoking status: Former Smoker    Last attempt to quit: 11/12/1991    Years since quitting: 27.0  . Smokeless tobacco: Never Used  Substance Use Topics  . Alcohol use: No  . Drug use: No    Review of Systems  Constitutional: No fever/chills Eyes: No visual changes. ENT: No sore throat. Cardiovascular: Denies chest pain. Respiratory: Denies shortness of  breath. Gastrointestinal: See HPI Genitourinary: Negative for dysuria. Musculoskeletal: Negative for back pain. Skin: Negative for rash. Neurological: Negative for headaches, focal weakness   ____________________________________________   PHYSICAL EXAM:  VITAL SIGNS: ED Triage Vitals  Enc Vitals Group     BP 11/10/18 1015 (!) 148/74     Pulse Rate 11/10/18 1015 68     Resp 11/10/18 1015 16     Temp 11/10/18 1015 99 F (37.2 C)     Temp Source 11/10/18 1015 Oral     SpO2 11/10/18 1015 95 %     Weight 11/10/18 1016 136 lb (61.7 kg)     Height 11/10/18 1016 5\' 5"  (1.651 m)     Head Circumference --      Peak Flow --      Pain Score  11/10/18 1015 0     Pain Loc --      Pain Edu? --      Excl. in [place]? --    Constitutional: Alert and oriented. Well appearing and in no acute distress. Eyes: Conjunctivae are normal.  Head: Atraumatic. Nose: No congestion/rhinnorhea. Mouth/Throat: Mucous membranes are moist.  Oropharynx non-erythematous. Neck: No stridor.  Cardiovascular: Normal rate, regular rhythm. Grossly normal heart sounds.  Good peripheral circulation. Respiratory: Normal respiratory effort.  No retractions. Lungs CTAB. Gastrointestinal: Soft mild tenderness on deep palpation no distention. No abdominal bruits. No CVA tenderness. Rectal: Normal sensation in the perirectal area patient does have some rectal tone possibly not enough to cause pain on my finger when I do her rectal exam but there is some tone there.  Stool is Hemoccult negative I do not feel any masses Musculoskeletal: No lower extremity tenderness nor edema.  No spinal tenderness on palpation or percussion Neurologic:  Normal speech and language. No gross focal neurologic deficits are appreciated. No gait instability. Skin:  Skin is warm, dry and intact. No rash noted. Psychiatric: Mood and affect are normal. Speech and behavior are normal.  ____________________________________________   LABS (all labs ordered are listed, but only abnormal results are displayed)  Labs Reviewed  COMPREHENSIVE METABOLIC PANEL - Abnormal; Notable for the following components:      Result Value   Potassium 3.3 (*)    Chloride 97 (*)    Glucose, Bld 102 (*)    All other components within normal limits  URINALYSIS, COMPLETE (UACMP) WITH MICROSCOPIC - Abnormal; Notable for the following components:   Color, Urine YELLOW (*)    APPearance CLEAR (*)    Hgb urine dipstick MODERATE (*)    Leukocytes, UA LARGE (*)    All other components within normal limits  LIPASE, BLOOD  CBC  OSMOLALITY, STOOL  CBC  BASIC METABOLIC PANEL    ____________________________________________  EKG   ____________________________________________  RADIOLOGY  ED MD interpretation: CT read by radiology reviewed by me shows diverticulitis with phlegmon formation around the sigmoid.  Patient complains of quite severe pain especially at night.  Area of the infection is deep and appears to be partially behind the bladder on the CT scan   Official radiology report(s): Ct Abdomen Pelvis W Contrast  Result Date: 11/10/2018 CLINICAL DATA:  Abdominal pain with diarrhea EXAM: CT ABDOMEN AND PELVIS WITH CONTRAST TECHNIQUE: Multidetector CT imaging of the abdomen and pelvis was performed using the standard protocol following bolus administration of intravenous contrast. CONTRAST:  36mL OMNIPAQUE IOHEXOL 300 MG/ML  SOLN COMPARISON:  None. FINDINGS: Lower chest: There is atelectatic change in left base region. There is no lung  base consolidation. Hepatobiliary: The liver contour is subtly lobular. No focal liver lesions are evident. Gallbladder wall is not appreciably thickened. There is no biliary duct dilatation. Pancreas: No pancreatic mass or inflammatory focus. Spleen: They were is a calcified granuloma in the spleen. Spleen otherwise appears unremarkable. Adrenals/Urinary Tract: Right adrenal appears normal. There is a x 8 mm nodular opacity in the left adrenal which has attenuation value in the indeterminate range for an adrenal mass. Kidneys bilaterally show no evident mass or hydronephrosis on either side. There is an extrarenal pelvis on each side, an anatomic variant. There is no renal or ureteral calculus on either side. Urinary bladder is midline with wall thickness within normal limits. Stomach/Bowel: There is extensive wall thickening in the distal sigmoid colon with several irregular diverticula. There is perinephric stranding and fluid between the diverticulum and urinary bladder. There is questionable early phlegmon development between the  urinary bladder an the area of sigmoid diverticulitis. This questionable early phlegmon measures 2.6 x 1.8 cm. There is no well-defined abscess. No perforation is appreciable in this area. Elsewhere, there is no appreciable bowel wall or mesenteric thickening. There is no evident bowel obstruction. There is no appreciable free air or portal venous air. There is moderate stool in the colon. Vascular/Lymphatic: There is extensive aortoiliac atherosclerosis. There is subtotal occlusion at the aortic bifurcation and origins of each internal carotid artery. No aneurysm. There is calcification in the major mesenteric arterial vessels without frank major mesenteric arterial vascular occlusion. There is no evident adenopathy in the abdomen or pelvis. Reproductive: Uterus is absent.  No pelvic mass is appreciable. Other: There is postoperative change in the anterior abdominal wall. No hernia present in this area. There is no periappendiceal region inflammation. Question absence of the appendix. No abscess or ascites is appreciable in the abdomen or pelvis. Musculoskeletal: There is anterior wedging of the T11 vertebral body. There is multilevel arthropathy in the lumbar spine. There is mid lumbar levoscoliosis. There are no blastic or lytic bone lesions. There is no intramuscular or abdominal wall lesion. IMPRESSION: 1. Diverticulitis involving the distal sigmoid colon with irregular diverticula and wall thickening in this region. There is extensive stranding surrounding the distal sigmoid colon. There is questionable early phlegmon development between the area of diverticulitis in the distal sigmoid colon in the posterior bladder. Note that the bladder wall is not thickened in this area. No frank abscess. No perforation evident. 2. No evident bowel obstruction. No abscess elsewhere in the abdomen or pelvis. No periappendiceal region inflammation. Question previous appendectomy. 3. Extensive aortoiliac atherosclerosis with  subtotal occlusion at the bifurcation and origins of the iliac arteries. 4. Subtle lobular contour of the liver. Question a degree of underlying hepatic cirrhosis. 5.  No renal or ureteral calculus.  No hydronephrosis. 6.  Calcified splenic granuloma noted. 7.  Uterus absent. Aortic Atherosclerosis (ICD10-I70.0). Electronically Signed   By: Lowella Grip III M.D.   On: 11/10/2018 15:39    ____________________________________________   PROCEDURES  Procedure(s) performed:   Procedures  Critical Care performed: Critical care 30 minutes including reexamining the patient twice and discussing the CT with her and the specialist  ____________________________________________   INITIAL IMPRESSION / [place] / ED COURSE  With phlegmon formation I think the patient would benefit from admission for IV antibiotics and observation in case it turns into an abscess that needs to be drained.  Additionally I will plan on getting a MRI of her L-spine as I still have no  explanation of why she cannot feel her stool coming out.         ____________________________________________   FINAL CLINICAL IMPRESSION(S) / ED DIAGNOSES  Final diagnoses:  Diverticulitis  Phlegmon  Incontinence of feces, unspecified fecal incontinence type     ED Discharge Orders    None       Note:  This document was prepared using Dragon voice recognition software and may include unintentional dictation errors.    Nena Polio, MD 11/10/18 2045

## 2018-11-10 NOTE — ED Notes (Signed)
Patient completed po contrast

## 2018-11-10 NOTE — H&P (Signed)
[facility] NAME: Zury Fazzino    MR#:  [id number]  DATE OF BIRTH:  19-Mar-1941  DATE OF ADMISSION:  11/10/2018  PRIMARY CARE PHYSICIAN: Rusty Aus, MD   REQUESTING/REFERRING PHYSICIAN: Nena Polio, MD  CHIEF COMPLAINT:   Chief Complaint  Patient presents with  . Abdominal Pain  . Diarrhea    HISTORY OF PRESENT ILLNESS: Athaliah Baumbach  is a 77 y.o. female with a known history of hypertension, chronic diarrhea, COPD who is presenting to the emergency room with complaint of abdominal pain in the lower abdominal area as well as the left lower quadrant since yesterday.  Patient states that she has had chronic diarrhea for more than 5 years now.  And has been tried on many medications.  However yesterday she started having pain radiating to her back. She did not have any fevers or chills.  Patient in the ER was noted to have severe diverticulitis  PAST MEDICAL HISTORY:   Past Medical History:  Diagnosis Date  . Arthritis    finger  . Chronic diarrhea   . Colon polyps   . COPD (chronic obstructive pulmonary disease) ([place])   . Diverticulosis   . Dyspnea   . GERD (gastroesophageal reflux disease)   . Headache   . Hypertension   . Thoracic scoliosis   . Vertigo   . Wears dentures    full upper and lower    PAST SURGICAL HISTORY:  Past Surgical History:  Procedure Laterality Date  . ABDOMINAL HYSTERECTOMY    . BROW LIFT Bilateral 02/11/2017   Procedure: bilateral secondary closure surgical wound dehisence extensive;  Surgeon: Karle Starch, MD;  Location: [facility];  Service: Ophthalmology;  Laterality: Bilateral;  . HERNIA REPAIR      SOCIAL HISTORY:  Social History   Tobacco Use  . Smoking status: Former Smoker    Last attempt to quit: 11/12/1991    Years since quitting: 27.0  . Smokeless tobacco: Never Used  Substance Use Topics  . Alcohol use: No    FAMILY HISTORY:  Family History  Problem Relation Age  of Onset  . Breast cancer Neg Hx     DRUG ALLERGIES:  Allergies  Allergen Reactions  . Codeine     headache  . Amitriptyline Palpitations  . Norvasc [Amlodipine Besylate] Palpitations    REVIEW OF SYSTEMS:   CONSTITUTIONAL: No fever, fatigue or weakness.  EYES: No blurred or double vision.  EARS, NOSE, AND THROAT: No tinnitus or ear pain.  RESPIRATORY: No cough, shortness of breath, wheezing or hemoptysis.  CARDIOVASCULAR: No chest pain, orthopnea, edema.  GASTROINTESTINAL: No nausea, vomiting, diarrhea or positive abdominal pain.  GENITOURINARY: No dysuria, hematuria.  ENDOCRINE: No polyuria, nocturia,  HEMATOLOGY: No anemia, easy bruising or bleeding SKIN: No rash or lesion. MUSCULOSKELETAL: No joint pain or arthritis.   NEUROLOGIC: No tingling, numbness, weakness.  PSYCHIATRY: No anxiety or depression.   MEDICATIONS AT HOME:  Prior to Admission medications   Medication Sig Start Date End Date Taking? Authorizing Provider  acetaminophen (TYLENOL) 325 MG tablet Take 650 mg by mouth every 6 (six) hours as needed.    [provider]  atenolol-chlorthalidone (TENORETIC 50) 50-25 MG tablet Take 1 tablet by mouth daily.    [provider]  cholecalciferol (VITAMIN D) 1000 units tablet Take 2,000 Units by mouth daily.    [provider]  lactobacillus acidophilus (BACID) TABS tablet Take 2 tablets by mouth  3 (three) times daily.    [provider]  meclizine (ANTIVERT) 25 MG tablet Take 25 mg by mouth 3 (three) times daily as needed for dizziness.    [provider]  omeprazole (PRILOSEC) 20 MG capsule Take 20 mg by mouth daily.    [provider]  traMADol (ULTRAM) 50 MG tablet Take by mouth 2 (two) times daily.    [provider]  vitamin B-12 (CYANOCOBALAMIN) 1000 MCG tablet Take 1,000 mcg by mouth daily.    [provider]      PHYSICAL EXAMINATION:   VITAL SIGNS: Blood pressure 122/74, pulse 67,  temperature 98.6 F (37 C), temperature source Oral, resp. rate 18, height 5\' 5"  (1.651 m), weight 61.7 kg, SpO2 99 %.  GENERAL:  77 y.o.-year-old patient lying in the bed with no acute distress.  EYES: Pupils equal, round, reactive to light and accommodation. No scleral icterus. Extraocular muscles intact.  HEENT: Head atraumatic, normocephalic. Oropharynx and nasopharynx clear.  NECK:  Supple, no jugular venous distention. No thyroid enlargement, no tenderness.  LUNGS: Normal breath sounds bilaterally, no wheezing, rales,rhonchi or crepitation. No use of accessory muscles of respiration.  CARDIOVASCULAR: S1, S2 normal. No murmurs, rubs, or gallops.  ABDOMEN: Soft, left lower quadrant tenderness nondistended. Bowel sounds present. No organomegaly or mass.  EXTREMITIES: No pedal edema, cyanosis, or clubbing.  NEUROLOGIC: Cranial nerves II through XII are intact. Muscle strength 5/5 in all extremities. Sensation intact. Gait not checked.  PSYCHIATRIC: The patient is alert and oriented x 3.  SKIN: No obvious rash, lesion, or ulcer.   LABORATORY PANEL:   CBC Recent Labs  Lab 11/10/18 1018  WBC 10.0  HGB 12.9  HCT 39.6  PLT 228  MCV 94.1  MCH 30.6  MCHC 32.6  RDW 11.8   ------------------------------------------------------------------------------------------------------------------  Chemistries  Recent Labs  Lab 11/10/18 1018  NA 136  K 3.3*  CL 97*  CO2 30  GLUCOSE 102*  BUN 22  CREATININE 0.83  CALCIUM 9.0  AST 18  ALT 12  ALKPHOS 64  BILITOT 0.5   ------------------------------------------------------------------------------------------------------------------ estimated creatinine clearance is 49.5 mL/min (by C-G formula based on SCr of 0.83 mg/dL). ------------------------------------------------------------------------------------------------------------------ No results for input(s): TSH, T4TOTAL, T3FREE, THYROIDAB in the last 72 hours.  Invalid input(s):  FREET3   Coagulation profile No results for input(s): INR, PROTIME in the last 168 hours. ------------------------------------------------------------------------------------------------------------------- No results for input(s): DDIMER in the last 72 hours. -------------------------------------------------------------------------------------------------------------------  Cardiac Enzymes No results for input(s): CKMB, TROPONINI, MYOGLOBIN in the last 168 hours.  Invalid input(s): CK ------------------------------------------------------------------------------------------------------------------ Invalid input(s): POCBNP  ---------------------------------------------------------------------------------------------------------------  Urinalysis    Component Value Date/Time   COLORURINE YELLOW (A) 11/10/2018 1018   APPEARANCEUR CLEAR (A) 11/10/2018 1018   LABSPEC 1.013 11/10/2018 1018   PHURINE 6.0 11/10/2018 1018   GLUCOSEU NEGATIVE 11/10/2018 1018   HGBUR MODERATE (A) 11/10/2018 1018   BILIRUBINUR NEGATIVE 11/10/2018 1018   KETONESUR NEGATIVE 11/10/2018 1018   PROTEINUR NEGATIVE 11/10/2018 1018   NITRITE NEGATIVE 11/10/2018 1018   LEUKOCYTESUR LARGE (A) 11/10/2018 1018     RADIOLOGY: Ct Abdomen Pelvis W Contrast  Result Date: 11/10/2018 CLINICAL DATA:  Abdominal pain with diarrhea EXAM: CT ABDOMEN AND PELVIS WITH CONTRAST TECHNIQUE: Multidetector CT imaging of the abdomen and pelvis was performed using the standard protocol following bolus administration of intravenous contrast. CONTRAST:  35mL OMNIPAQUE IOHEXOL 300 MG/ML  SOLN COMPARISON:  None. FINDINGS: Lower chest: There is atelectatic change in left base region. There is no lung base  consolidation. Hepatobiliary: The liver contour is subtly lobular. No focal liver lesions are evident. Gallbladder wall is not appreciably thickened. There is no biliary duct dilatation. Pancreas: No pancreatic mass or inflammatory focus.  Spleen: They were is a calcified granuloma in the spleen. Spleen otherwise appears unremarkable. Adrenals/Urinary Tract: Right adrenal appears normal. There is a x 8 mm nodular opacity in the left adrenal which has attenuation value in the indeterminate range for an adrenal mass. Kidneys bilaterally show no evident mass or hydronephrosis on either side. There is an extrarenal pelvis on each side, an anatomic variant. There is no renal or ureteral calculus on either side. Urinary bladder is midline with wall thickness within normal limits. Stomach/Bowel: There is extensive wall thickening in the distal sigmoid colon with several irregular diverticula. There is perinephric stranding and fluid between the diverticulum and urinary bladder. There is questionable early phlegmon development between the urinary bladder an the area of sigmoid diverticulitis. This questionable early phlegmon measures 2.6 x 1.8 cm. There is no well-defined abscess. No perforation is appreciable in this area. Elsewhere, there is no appreciable bowel wall or mesenteric thickening. There is no evident bowel obstruction. There is no appreciable free air or portal venous air. There is moderate stool in the colon. Vascular/Lymphatic: There is extensive aortoiliac atherosclerosis. There is subtotal occlusion at the aortic bifurcation and origins of each internal carotid artery. No aneurysm. There is calcification in the major mesenteric arterial vessels without frank major mesenteric arterial vascular occlusion. There is no evident adenopathy in the abdomen or pelvis. Reproductive: Uterus is absent.  No pelvic mass is appreciable. Other: There is postoperative change in the anterior abdominal wall. No hernia present in this area. There is no periappendiceal region inflammation. Question absence of the appendix. No abscess or ascites is appreciable in the abdomen or pelvis. Musculoskeletal: There is anterior wedging of the T11 vertebral body. There is  multilevel arthropathy in the lumbar spine. There is mid lumbar levoscoliosis. There are no blastic or lytic bone lesions. There is no intramuscular or abdominal wall lesion. IMPRESSION: 1. Diverticulitis involving the distal sigmoid colon with irregular diverticula and wall thickening in this region. There is extensive stranding surrounding the distal sigmoid colon. There is questionable early phlegmon development between the area of diverticulitis in the distal sigmoid colon in the posterior bladder. Note that the bladder wall is not thickened in this area. No frank abscess. No perforation evident. 2. No evident bowel obstruction. No abscess elsewhere in the abdomen or pelvis. No periappendiceal region inflammation. Question previous appendectomy. 3. Extensive aortoiliac atherosclerosis with subtotal occlusion at the bifurcation and origins of the iliac arteries. 4. Subtle lobular contour of the liver. Question a degree of underlying hepatic cirrhosis. 5.  No renal or ureteral calculus.  No hydronephrosis. 6.  Calcified splenic granuloma noted. 7.  Uterus absent. Aortic Atherosclerosis (ICD10-I70.0). Electronically Signed   By: Lowella Grip III M.D.   On: 11/10/2018 15:39    EKG: No orders found for this or any previous visit.  IMPRESSION AND PLAN: Patient is 77 year old presenting with abdominal pain  1.  Acute diverticulitis CT scan shows severe diverticulitis we will treat with IV Unasyn  2.  Essential hypertension continue atenolol and chlorthalidone but pressure currently stable  3.  Chronic diarrhea needs outpatient GI follow-up  4.  GERD continue Prilosec  5, miscellaneous Lovenox for DVT prophylaxis  All the records are reviewed and case discussed with ED provider. Management plans discussed with the patient, family and  they are in agreement.  CODE STATUS: Full    TOTAL TIME TAKING CARE OF THIS PATIENT: 28minutes.    Dustin Flock M.D on 11/10/2018 at 5:57 PM  Between  7am to 6pm - Pager - [phone]  After 6pm go to [email or web address] - password [email or web address]  [facility] Office  [phone]  CC: Primary care physician; Rusty Aus, MD

## 2018-11-10 NOTE — Consult Note (Signed)
Pharmacy Antibiotic Note  Karina Stone is a 77 y.o. female admitted on 11/10/2018 with intra-abdominal infection.  Pharmacy has been consulted for Unasyn dosing.  Plan: Unasyn 3g q 6h  Height: 5\' 5"  (165.1 cm) Weight: 136 lb (61.7 kg) IBW/kg (Calculated) : 57  Temp (24hrs), Avg:98.6 F (37 C), Min:98.3 F (36.8 C), Max:99 F (37.2 C)  Recent Labs  Lab 11/10/18 1018  WBC 10.0  CREATININE 0.83    Estimated Creatinine Clearance: 49.5 mL/min (by C-G formula based on SCr of 0.83 mg/dL).    Allergies  Allergen Reactions  . Codeine     headache  . Amitriptyline Palpitations  . Norvasc [Amlodipine Besylate] Palpitations    Antimicrobials this admission: Cipro 12/31 >> 12/31 Metronidazole 12/31 >> 12/31 Unasyn 12/31 >>  Dose adjustments this admission: N/A  No microbiological studies pending at this time  Thank you for allowing pharmacy to be a part of this patient's care.  Lu Duffel, PharmD, BCPS Clinical Pharmacist 11/10/2018 6:26 PM

## 2018-11-10 NOTE — ED Notes (Signed)
Rectal exam performed, neg heme noted. IV hep lock placed patient awaiting ct abd.

## 2018-11-10 NOTE — ED Notes (Signed)
Ct completed awaiting results and further plabn of care. Family at bedside. Will continue to monitor. Safety maintained.

## 2018-11-11 LAB — BASIC METABOLIC PANEL
ANION GAP: 8 (ref 5–15)
BUN: 17 mg/dL (ref 8–23)
CO2: 29 mmol/L (ref 22–32)
Calcium: 8.3 mg/dL — ABNORMAL LOW (ref 8.9–10.3)
Chloride: 100 mmol/L (ref 98–111)
Creatinine, Ser: 0.93 mg/dL (ref 0.44–1.00)
GFR calc Af Amer: >60 mL/min (ref >60)
GFR calc non Af Amer: 58 mL/min — ABNORMAL LOW (ref >60)
Glucose, Bld: 108 mg/dL — ABNORMAL HIGH (ref 70–99)
Potassium: 3.5 mmol/L (ref 3.5–5.1)
Sodium: 137 mmol/L (ref 135–145)

## 2018-11-11 LAB — CBC
HCT: 36 % (ref 36.0–46.0)
Hemoglobin: 11.7 g/dL — ABNORMAL LOW (ref 12.0–15.0)
MCH: 30.6 pg (ref 26.0–34.0)
MCHC: 32.5 g/dL (ref 30.0–36.0)
MCV: 94.2 fL (ref 80.0–100.0)
Platelets: 200 10*3/uL (ref 150–400)
RBC: 3.82 MIL/uL — AB (ref 3.87–5.11)
RDW: 11.7 % (ref 11.5–15.5)
WBC: 8 10*3/uL (ref 4.0–10.5)
nRBC: 0 % (ref 0.0–0.2)

## 2018-11-11 NOTE — Plan of Care (Signed)
IV antibiotics provided. No falls. The patient has ambulated outside of the hallway. Diet changed.  Problem: Education: Goal: Knowledge of General Education information will improve Description Including pain rating scale, medication(s)/side effects and non-pharmacologic comfort measures Outcome: Progressing   Problem: Health Behavior/Discharge Planning: Goal: Ability to manage health-related needs will improve Outcome: Progressing   Problem: Clinical Measurements: Goal: Ability to maintain clinical measurements within normal limits will improve Outcome: Progressing Goal: Will remain free from infection Outcome: Progressing Goal: Diagnostic test results will improve Outcome: Progressing Goal: Respiratory complications will improve Outcome: Progressing Goal: Cardiovascular complication will be avoided Outcome: Progressing   Problem: Activity: Goal: Risk for activity intolerance will decrease Outcome: Progressing   Problem: Nutrition: Goal: Adequate nutrition will be maintained Outcome: Progressing   Problem: Coping: Goal: Level of anxiety will decrease Outcome: Progressing   Problem: Elimination: Goal: Will not experience complications related to bowel motility Outcome: Progressing Goal: Will not experience complications related to urinary retention Outcome: Progressing   Problem: Pain Managment: Goal: General experience of comfort will improve Outcome: Progressing   Problem: Safety: Goal: Ability to remain free from injury will improve Outcome: Progressing   Problem: Skin Integrity: Goal: Risk for impaired skin integrity will decrease Outcome: Progressing

## 2018-11-11 NOTE — Progress Notes (Signed)
[place] [facility]                                                                                                                                                                                  Patient Demographics   Karina Stone, is a 78 y.o. female, DOB - [birth year], MBW:[id number]  Admit date - 11/10/2018   Admitting Physician Dustin Flock, MD  Outpatient Primary MD for the patient is Rusty Aus, MD   LOS - 1  Subjective: Patient states that her abdominal pain is much improved.  Denies any nausea vomiting     Review of Systems:   CONSTITUTIONAL: No documented fever. No fatigue, weakness. No weight gain, no weight loss.  EYES: No blurry or double vision.  ENT: No tinnitus. No postnasal drip. No redness of the oropharynx.  RESPIRATORY: No cough, no wheeze, no hemoptysis. No dyspnea.  CARDIOVASCULAR: No chest pain. No orthopnea. No palpitations. No syncope.  GASTROINTESTINAL: No nausea, no vomiting or diarrhea.  Positive abdominal pain. No melena or hematochezia.  GENITOURINARY: No dysuria or hematuria.  ENDOCRINE: No polyuria or nocturia. No heat or cold intolerance.  HEMATOLOGY: No anemia. No bruising. No bleeding.  INTEGUMENTARY: No rashes. No lesions.  MUSCULOSKELETAL: No arthritis. No swelling. No gout.  NEUROLOGIC: No numbness, tingling, or ataxia. No seizure-type activity.  PSYCHIATRIC: No anxiety. No insomnia. No ADD.    Vitals:   Vitals:   11/10/18 1957 11/11/18 0450 11/11/18 0759 11/11/18 1145  BP: 114/84 (!) 146/79 125/61 136/67  Pulse: 67 61 63 (!) 57  Resp: 20 20 18 16   Temp: (!) 101 F (38.3 C) 98.8 F (37.1 C) 99.2 F (37.3 C) 98.2 F (36.8 C)  TempSrc: Oral Oral Oral Oral  SpO2: 93% 93% 93% 96%  Weight:      Height:        Wt Readings from Last 3 Encounters:  11/10/18 61.7 kg  02/11/17 63.5 kg  11/15/16 63.5 kg     Intake/Output Summary (Last 24 hours) at 11/11/2018 1447 Last data filed at 11/11/2018  1122 Gross per 24 hour  Intake 1897.24 ml  Output 900 ml  Net 997.24 ml    Physical Exam:   GENERAL: Pleasant-appearing in no apparent distress.  HEAD, EYES, EARS, NOSE AND THROAT: Atraumatic, normocephalic. Extraocular muscles are intact. Pupils equal and reactive to light. Sclerae anicteric. No conjunctival injection. No oro-pharyngeal erythema.  NECK: Supple. There is no jugular venous distention. No bruits, no lymphadenopathy, no thyromegaly.  HEART: Regular rate and rhythm,. No murmurs, no rubs, no clicks.  LUNGS: Clear to auscultation bilaterally. No rales or rhonchi. No wheezes.  ABDOMEN: Soft, flat, positive  left lower quadrant tenderness, nondistended. Has good bowel sounds. No hepatosplenomegaly appreciated.  EXTREMITIES: No evidence of any cyanosis, clubbing, or peripheral edema.  +2 pedal and radial pulses bilaterally.  NEUROLOGIC: The patient is alert, awake, and oriented x3 with no focal motor or sensory deficits appreciated bilaterally.  SKIN: Moist and warm with no rashes appreciated.  Psych: Not anxious, depressed LN: No inguinal LN enlargement    Antibiotics   Anti-infectives (From admission, onward)   Start     Dose/Rate Route Frequency Ordered Stop   11/10/18 1930  Ampicillin-Sulbactam (UNASYN) 3 g in sodium chloride 0.9 % 100 mL IVPB     3 g 200 mL/hr over 30 Minutes Intravenous Every 6 hours 11/10/18 1823     11/10/18 1645  ciprofloxacin (CIPRO) IVPB 400 mg     400 mg 200 mL/hr over 60 Minutes Intravenous  Once 11/10/18 1634 11/10/18 1850   11/10/18 1645  metroNIDAZOLE (FLAGYL) IVPB 500 mg     500 mg 100 mL/hr over 60 Minutes Intravenous  Once 11/10/18 1634 11/10/18 1838      Medications   Scheduled Meds: . atenolol  50 mg Oral Daily   And  . chlorthalidone  25 mg Oral Daily  . enoxaparin (LOVENOX) injection  40 mg Subcutaneous Q24H  . pantoprazole  40 mg Oral Daily   Continuous Infusions: . ampicillin-sulbactam (UNASYN) IV 3 g (11/11/18 1346)    PRN Meds:.acetaminophen **OR** acetaminophen, meclizine, ondansetron **OR** ondansetron (ZOFRAN) IV, traMADol   Data Review:   Micro Results No results found for this or any previous visit (from the past 240 hour(s)).  Radiology Reports Ct Abdomen Pelvis W Contrast  Result Date: 11/10/2018 CLINICAL DATA:  Abdominal pain with diarrhea EXAM: CT ABDOMEN AND PELVIS WITH CONTRAST TECHNIQUE: Multidetector CT imaging of the abdomen and pelvis was performed using the standard protocol following bolus administration of intravenous contrast. CONTRAST:  7mL OMNIPAQUE IOHEXOL 300 MG/ML  SOLN COMPARISON:  None. FINDINGS: Lower chest: There is atelectatic change in left base region. There is no lung base consolidation. Hepatobiliary: The liver contour is subtly lobular. No focal liver lesions are evident. Gallbladder wall is not appreciably thickened. There is no biliary duct dilatation. Pancreas: No pancreatic mass or inflammatory focus. Spleen: They were is a calcified granuloma in the spleen. Spleen otherwise appears unremarkable. Adrenals/Urinary Tract: Right adrenal appears normal. There is a x 8 mm nodular opacity in the left adrenal which has attenuation value in the indeterminate range for an adrenal mass. Kidneys bilaterally show no evident mass or hydronephrosis on either side. There is an extrarenal pelvis on each side, an anatomic variant. There is no renal or ureteral calculus on either side. Urinary bladder is midline with wall thickness within normal limits. Stomach/Bowel: There is extensive wall thickening in the distal sigmoid colon with several irregular diverticula. There is perinephric stranding and fluid between the diverticulum and urinary bladder. There is questionable early phlegmon development between the urinary bladder an the area of sigmoid diverticulitis. This questionable early phlegmon measures 2.6 x 1.8 cm. There is no well-defined abscess. No perforation is appreciable in this  area. Elsewhere, there is no appreciable bowel wall or mesenteric thickening. There is no evident bowel obstruction. There is no appreciable free air or portal venous air. There is moderate stool in the colon. Vascular/Lymphatic: There is extensive aortoiliac atherosclerosis. There is subtotal occlusion at the aortic bifurcation and origins of each internal carotid artery. No aneurysm. There is calcification in the major mesenteric arterial vessels  without frank major mesenteric arterial vascular occlusion. There is no evident adenopathy in the abdomen or pelvis. Reproductive: Uterus is absent.  No pelvic mass is appreciable. Other: There is postoperative change in the anterior abdominal wall. No hernia present in this area. There is no periappendiceal region inflammation. Question absence of the appendix. No abscess or ascites is appreciable in the abdomen or pelvis. Musculoskeletal: There is anterior wedging of the T11 vertebral body. There is multilevel arthropathy in the lumbar spine. There is mid lumbar levoscoliosis. There are no blastic or lytic bone lesions. There is no intramuscular or abdominal wall lesion. IMPRESSION: 1. Diverticulitis involving the distal sigmoid colon with irregular diverticula and wall thickening in this region. There is extensive stranding surrounding the distal sigmoid colon. There is questionable early phlegmon development between the area of diverticulitis in the distal sigmoid colon in the posterior bladder. Note that the bladder wall is not thickened in this area. No frank abscess. No perforation evident. 2. No evident bowel obstruction. No abscess elsewhere in the abdomen or pelvis. No periappendiceal region inflammation. Question previous appendectomy. 3. Extensive aortoiliac atherosclerosis with subtotal occlusion at the bifurcation and origins of the iliac arteries. 4. Subtle lobular contour of the liver. Question a degree of underlying hepatic cirrhosis. 5.  No renal or  ureteral calculus.  No hydronephrosis. 6.  Calcified splenic granuloma noted. 7.  Uterus absent. Aortic Atherosclerosis (ICD10-I70.0). Electronically Signed   By: Lowella Grip III M.D.   On: 11/10/2018 15:39     CBC Recent Labs  Lab 11/10/18 1018 11/11/18 0607  WBC 10.0 8.0  HGB 12.9 11.7*  HCT 39.6 36.0  PLT 228 200  MCV 94.1 94.2  MCH 30.6 30.6  MCHC 32.6 32.5  RDW 11.8 11.7    Chemistries  Recent Labs  Lab 11/10/18 1018 11/11/18 0607  NA 136 137  K 3.3* 3.5  CL 97* 100  CO2 30 29  GLUCOSE 102* 108*  BUN 22 17  CREATININE 0.83 0.93  CALCIUM 9.0 8.3*  AST 18  --   ALT 12  --   ALKPHOS 64  --   BILITOT 0.5  --    ------------------------------------------------------------------------------------------------------------------ estimated creatinine clearance is 44.1 mL/min (by C-G formula based on SCr of 0.93 mg/dL). ------------------------------------------------------------------------------------------------------------------ No results for input(s): HGBA1C in the last 72 hours. ------------------------------------------------------------------------------------------------------------------ No results for input(s): CHOL, HDL, LDLCALC, TRIG, CHOLHDL, LDLDIRECT in the last 72 hours. ------------------------------------------------------------------------------------------------------------------ No results for input(s): TSH, T4TOTAL, T3FREE, THYROIDAB in the last 72 hours.  Invalid input(s): FREET3 ------------------------------------------------------------------------------------------------------------------ No results for input(s): VITAMINB12, FOLATE, FERRITIN, TIBC, IRON, RETICCTPCT in the last 72 hours.  Coagulation profile No results for input(s): INR, PROTIME in the last 168 hours.  No results for input(s): DDIMER in the last 72 hours.  Cardiac Enzymes No results for input(s): CKMB, TROPONINI, MYOGLOBIN in the last 168 hours.  Invalid input(s):  CK ------------------------------------------------------------------------------------------------------------------ Invalid input(s): POCBNP    Assessment & Plan   IMPRESSION AND PLAN: Patient is 78 year old presenting with abdominal pain  1.  Acute diverticulitis CT scan shows severe diverticulitis continue Unasyn 1 more day and start Augmentin tomorrow I will advance her diet to low-fat  2.  Essential hypertension continue atenolol and chlorthalidone but pressure currently stable  3.  Chronic diarrhea needs outpatient GI follow-up  4.  GERD continue Prilosec     Code Status Orders  (From admission, onward)         Start     Ordered   11/10/18 1957  Full code  Continuous     11/10/18 1956        Code Status History    This patient has a current code status but no historical code status.           Consults none  DVT Prophylaxis  Lovenox  Lab Results  Component Value Date   PLT 200 11/11/2018     Time Spent in minutes   52min  Greater than 50% of time spent in care coordination and counseling patient regarding the condition and plan of care.   Dustin Flock M.D on 11/11/2018 at 2:47 PM  Between 7am to 6pm - Pager - [phone]  After 6pm go to [email or web address] - Proofreader  [employer]   Office  [phone]

## 2018-11-12 MED ORDER — AMOXICILLIN-POT CLAVULANATE 875-125 MG PO TABS: 1.0000 | ORAL_TABLET | Freq: Two times a day (BID) | ORAL | 0 refills | Status: AC

## 2018-11-12 NOTE — Progress Notes (Signed)
Patient discharge teaching given, including activity, diet, follow-up appoints, and medications. Patient verbalized understanding of all discharge instructions. IV access was d/c'd. Vitals are stable. Skin is intact except as charted in most recent assessments. Pt to be escorted out by volunteer, to be driven home by family.  Karina Stone  

## 2018-11-12 NOTE — Discharge Summary (Signed)
[employer] - [place] at [facility], 78 y.o., DOB [birth year], MRN [id number]. Admission date: 11/10/2018 Discharge Date 11/12/2018 Primary MD Rusty Aus, MD Admitting Physician Dustin Flock, MD  Admission Diagnosis  Phlegmon [L02.91] Diverticulitis [K57.92] Incontinence of feces, unspecified fecal incontinence type [R15.9]  Discharge Diagnosis   Active Problems:   Acute diverticulitis Chronic diarrhea Essential hypertension GERD     Hospital Course  Karina Stone  is a 78 y.o. female with a known history of hypertension, chronic diarrhea, COPD who is presenting to the emergency room with complaint of abdominal pain in the lower abdominal area as well as the left lower quadrant.  Patient was evaluated in the ER and had a CT scan of the abdomen which showed diverticulitis.  Patient was treated with IV antibiotics with significant improvement in her symptoms.  Patient does have chronic diarrhea.  Which I will refer her to GI for further evaluation if it persists.           Consults  None  Significant Tests:  See full reports for all details     Ct Abdomen Pelvis W Contrast  Result Date: 11/10/2018 CLINICAL DATA:  Abdominal pain with diarrhea EXAM: CT ABDOMEN AND PELVIS WITH CONTRAST TECHNIQUE: Multidetector CT imaging of the abdomen and pelvis was performed using the standard protocol following bolus administration of intravenous contrast. CONTRAST:  29mL OMNIPAQUE IOHEXOL 300 MG/ML  SOLN COMPARISON:  None. FINDINGS: Lower chest: There is atelectatic change in left base region. There is no lung base consolidation. Hepatobiliary: The liver contour is subtly lobular. No focal liver lesions are evident. Gallbladder wall is not appreciably thickened. There is no biliary duct dilatation. Pancreas: No pancreatic mass or inflammatory focus. Spleen: They were is a calcified granuloma in the spleen. Spleen otherwise appears unremarkable. Adrenals/Urinary  Tract: Right adrenal appears normal. There is a x 8 mm nodular opacity in the left adrenal which has attenuation value in the indeterminate range for an adrenal mass. Kidneys bilaterally show no evident mass or hydronephrosis on either side. There is an extrarenal pelvis on each side, an anatomic variant. There is no renal or ureteral calculus on either side. Urinary bladder is midline with wall thickness within normal limits. Stomach/Bowel: There is extensive wall thickening in the distal sigmoid colon with several irregular diverticula. There is perinephric stranding and fluid between the diverticulum and urinary bladder. There is questionable early phlegmon development between the urinary bladder an the area of sigmoid diverticulitis. This questionable early phlegmon measures 2.6 x 1.8 cm. There is no well-defined abscess. No perforation is appreciable in this area. Elsewhere, there is no appreciable bowel wall or mesenteric thickening. There is no evident bowel obstruction. There is no appreciable free air or portal venous air. There is moderate stool in the colon. Vascular/Lymphatic: There is extensive aortoiliac atherosclerosis. There is subtotal occlusion at the aortic bifurcation and origins of each internal carotid artery. No aneurysm. There is calcification in the major mesenteric arterial vessels without frank major mesenteric arterial vascular occlusion. There is no evident adenopathy in the abdomen or pelvis. Reproductive: Uterus is absent.  No pelvic mass is appreciable. Other: There is postoperative change in the anterior abdominal wall. No hernia present in this area. There is no periappendiceal region inflammation. Question absence of the appendix. No abscess or ascites is appreciable in the abdomen or pelvis. Musculoskeletal: There is anterior wedging of the T11 vertebral body. There is multilevel arthropathy in the lumbar spine. There is mid  lumbar levoscoliosis. There are no blastic or lytic bone  lesions. There is no intramuscular or abdominal wall lesion. IMPRESSION: 1. Diverticulitis involving the distal sigmoid colon with irregular diverticula and wall thickening in this region. There is extensive stranding surrounding the distal sigmoid colon. There is questionable early phlegmon development between the area of diverticulitis in the distal sigmoid colon in the posterior bladder. Note that the bladder wall is not thickened in this area. No frank abscess. No perforation evident. 2. No evident bowel obstruction. No abscess elsewhere in the abdomen or pelvis. No periappendiceal region inflammation. Question previous appendectomy. 3. Extensive aortoiliac atherosclerosis with subtotal occlusion at the bifurcation and origins of the iliac arteries. 4. Subtle lobular contour of the liver. Question a degree of underlying hepatic cirrhosis. 5.  No renal or ureteral calculus.  No hydronephrosis. 6.  Calcified splenic granuloma noted. 7.  Uterus absent. Aortic Atherosclerosis (ICD10-I70.0). Electronically Signed   By: Lowella Grip III M.D.   On: 11/10/2018 15:39       Today   Subjective:   Karina Stone patient feels well abdominal pain now resolved Objective:   Blood pressure (!) 155/74, pulse 69, temperature 97.9 F (36.6 C), resp. rate 18, height 5\' 5"  (1.651 m), weight 61.7 kg, SpO2 93 %.  .  Intake/Output Summary (Last 24 hours) at 11/12/2018 1605 Last data filed at 11/12/2018 0900 Gross per 24 hour  Intake 881.67 ml  Output -  Net 881.67 ml    Exam VITAL SIGNS: Blood pressure (!) 155/74, pulse 69, temperature 97.9 F (36.6 C), resp. rate 18, height 5\' 5"  (1.651 m), weight 61.7 kg, SpO2 93 %.  GENERAL:  78 y.o.-year-old patient lying in the bed with no acute distress.  EYES: Pupils equal, round, reactive to light and accommodation. No scleral icterus. Extraocular muscles intact.  HEENT: Head atraumatic, normocephalic. Oropharynx and nasopharynx clear.  NECK:  Supple, no jugular  venous distention. No thyroid enlargement, no tenderness.  LUNGS: Normal breath sounds bilaterally, no wheezing, rales,rhonchi or crepitation. No use of accessory muscles of respiration.  CARDIOVASCULAR: S1, S2 normal. No murmurs, rubs, or gallops.  ABDOMEN: Soft, nontender, nondistended. Bowel sounds present. No organomegaly or mass.  EXTREMITIES: No pedal edema, cyanosis, or clubbing.  NEUROLOGIC: Cranial nerves II through XII are intact. Muscle strength 5/5 in all extremities. Sensation intact. Gait not checked.  PSYCHIATRIC: The patient is alert and oriented x 3.  SKIN: No obvious rash, lesion, or ulcer.   Data Review     CBC w Diff:  Lab Results  Component Value Date   WBC 8.0 11/11/2018   HGB 11.7 (L) 11/11/2018   HCT 36.0 11/11/2018   PLT 200 11/11/2018   CMP:  Lab Results  Component Value Date   NA 137 11/11/2018   K 3.5 11/11/2018   K 3.6 03/24/2013   CL 100 11/11/2018   CO2 29 11/11/2018   BUN 17 11/11/2018   CREATININE 0.93 11/11/2018   PROT 7.4 11/10/2018   ALBUMIN 3.9 11/10/2018   BILITOT 0.5 11/10/2018   ALKPHOS 64 11/10/2018   AST 18 11/10/2018   ALT 12 11/10/2018  .  Micro Results No results found for this or any previous visit (from the past 240 hour(s)).      Code Status Orders  (From admission, onward)         Start     Ordered   11/10/18 1957  Full code  Continuous     11/10/18 1956  Code Status History    This patient has a current code status but no historical code status.          Follow-up Information    Rusty Aus, MD Follow up in 6 day(s).   Specialty:  Internal Medicine Contact information: [place] [address] [phone]        Jonathon Bellows, MD Follow up in 4 week(s).   Specialty:  Gastroenterology Why:  for chronic diarrhea  Contact information: [place] [address] [phone]           Discharge  Medications   Allergies as of 11/12/2018      Reactions   Codeine    headache   Amitriptyline Palpitations   Norvasc [amlodipine Besylate] Palpitations      Medication List    TAKE these medications   acetaminophen 325 MG tablet Commonly known as:  TYLENOL Take 650 mg by mouth every 6 (six) hours as needed for mild pain or moderate pain.   amoxicillin-clavulanate 875-125 MG tablet Commonly known as:  AUGMENTIN Take 1 tablet by mouth 2 (two) times daily for 7 days.   cholecalciferol 1000 units tablet Commonly known as:  VITAMIN D Take 2,000 Units by mouth daily.   meclizine 25 MG tablet Commonly known as:  ANTIVERT Take 25 mg by mouth 3 (three) times daily as needed for dizziness.   omeprazole 20 MG capsule Commonly known as:  PRILOSEC Take 20 mg by mouth daily.   TENORETIC 50 50-25 MG tablet Generic drug:  atenolol-chlorthalidone Take 1 tablet by mouth daily.   traMADol 50 MG tablet Commonly known as:  ULTRAM Take 50 mg by mouth 2 (two) times daily as needed for moderate pain.   vitamin B-12 1000 MCG tablet Commonly known as:  CYANOCOBALAMIN Take 1,000 mcg by mouth daily.          Total Time in preparing paper work, data evaluation and todays exam - 40 minutes  Dustin Flock M.D on 11/12/2018 at [place]  [phone]

## 2018-11-18 DIAGNOSIS — I739 Peripheral vascular disease, unspecified: Secondary | ICD-10-CM | POA: Diagnosis not present

## 2018-11-18 DIAGNOSIS — K5792 Diverticulitis of intestine, part unspecified, without perforation or abscess without bleeding: Secondary | ICD-10-CM | POA: Diagnosis not present

## 2018-11-18 DIAGNOSIS — I10 Essential (primary) hypertension: Secondary | ICD-10-CM | POA: Diagnosis not present

## 2018-11-23 DIAGNOSIS — L03031 Cellulitis of right toe: Secondary | ICD-10-CM | POA: Diagnosis not present

## 2019-01-08 DIAGNOSIS — H353131 Nonexudative age-related macular degeneration, bilateral, early dry stage: Secondary | ICD-10-CM | POA: Diagnosis not present

## 2019-01-08 DIAGNOSIS — H2511 Age-related nuclear cataract, right eye: Secondary | ICD-10-CM | POA: Diagnosis not present

## 2019-01-11 DIAGNOSIS — W010XXA Fall on same level from slipping, tripping and stumbling without subsequent striking against object, initial encounter: Secondary | ICD-10-CM | POA: Diagnosis not present

## 2019-01-11 DIAGNOSIS — M545 Low back pain: Secondary | ICD-10-CM | POA: Diagnosis not present

## 2019-01-11 DIAGNOSIS — M5489 Other dorsalgia: Secondary | ICD-10-CM | POA: Diagnosis not present

## 2019-01-11 DIAGNOSIS — M546 Pain in thoracic spine: Secondary | ICD-10-CM | POA: Diagnosis not present

## 2019-01-12 ENCOUNTER — Other Ambulatory Visit: Payer: Self-pay | Admitting: Physician Assistant

## 2019-01-12 DIAGNOSIS — S22000A Wedge compression fracture of unspecified thoracic vertebra, initial encounter for closed fracture: Secondary | ICD-10-CM

## 2019-01-12 DIAGNOSIS — M549 Dorsalgia, unspecified: Secondary | ICD-10-CM

## 2019-01-14 DIAGNOSIS — J90 Pleural effusion, not elsewhere classified: Secondary | ICD-10-CM | POA: Diagnosis not present

## 2019-01-14 DIAGNOSIS — R071 Chest pain on breathing: Secondary | ICD-10-CM | POA: Diagnosis not present

## 2019-01-14 DIAGNOSIS — J181 Lobar pneumonia, unspecified organism: Secondary | ICD-10-CM | POA: Diagnosis not present

## 2019-01-14 DIAGNOSIS — S2231XA Fracture of one rib, right side, initial encounter for closed fracture: Secondary | ICD-10-CM | POA: Diagnosis not present

## 2019-01-17 ENCOUNTER — Ambulatory Visit
Admission: RE | Admit: 2019-01-17 | Discharge: 2019-01-17 | Disposition: A | Discharge status: Home / Self Care | Payer: PPO | Source: Ambulatory Visit | Attending: Physician Assistant | Admitting: Physician Assistant

## 2019-01-17 ENCOUNTER — Other Ambulatory Visit: Payer: Self-pay

## 2019-01-17 DIAGNOSIS — S22000A Wedge compression fracture of unspecified thoracic vertebra, initial encounter for closed fracture: Secondary | ICD-10-CM | POA: Diagnosis not present

## 2019-01-17 DIAGNOSIS — M549 Dorsalgia, unspecified: Secondary | ICD-10-CM | POA: Diagnosis not present

## 2019-01-17 DIAGNOSIS — M546 Pain in thoracic spine: Secondary | ICD-10-CM | POA: Diagnosis not present

## 2019-01-18 DIAGNOSIS — S29019A Strain of muscle and tendon of unspecified wall of thorax, initial encounter: Secondary | ICD-10-CM | POA: Diagnosis not present

## 2019-01-18 DIAGNOSIS — S2231XD Fracture of one rib, right side, subsequent encounter for fracture with routine healing: Secondary | ICD-10-CM | POA: Diagnosis not present

## 2019-01-18 DIAGNOSIS — R0789 Other chest pain: Secondary | ICD-10-CM | POA: Diagnosis not present

## 2019-01-18 DIAGNOSIS — G4700 Insomnia, unspecified: Secondary | ICD-10-CM | POA: Diagnosis not present

## 2019-02-08 DIAGNOSIS — S29019A Strain of muscle and tendon of unspecified wall of thorax, initial encounter: Secondary | ICD-10-CM | POA: Diagnosis not present

## 2019-03-03 DIAGNOSIS — M5116 Intervertebral disc disorders with radiculopathy, lumbar region: Secondary | ICD-10-CM | POA: Diagnosis not present

## 2019-03-03 DIAGNOSIS — E538 Deficiency of other specified B group vitamins: Secondary | ICD-10-CM | POA: Diagnosis not present

## 2019-03-03 DIAGNOSIS — I739 Peripheral vascular disease, unspecified: Secondary | ICD-10-CM | POA: Diagnosis not present

## 2019-03-09 ENCOUNTER — Ambulatory Visit: Admit: 2019-03-09 | Payer: PPO | Admitting: Ophthalmology

## 2019-03-09 SURGERY — PHACOEMULSIFICATION, CATARACT, WITH IOL INSERTION
Anesthesia: Choice | Laterality: Right

## 2019-03-17 DIAGNOSIS — E538 Deficiency of other specified B group vitamins: Secondary | ICD-10-CM | POA: Diagnosis not present

## 2019-03-17 DIAGNOSIS — I739 Peripheral vascular disease, unspecified: Secondary | ICD-10-CM | POA: Diagnosis not present

## 2019-03-17 DIAGNOSIS — M5116 Intervertebral disc disorders with radiculopathy, lumbar region: Secondary | ICD-10-CM | POA: Diagnosis not present

## 2019-03-17 DIAGNOSIS — E782 Mixed hyperlipidemia: Secondary | ICD-10-CM | POA: Diagnosis not present

## 2019-03-23 DIAGNOSIS — I739 Peripheral vascular disease, unspecified: Secondary | ICD-10-CM | POA: Diagnosis not present

## 2019-04-21 DIAGNOSIS — H2511 Age-related nuclear cataract, right eye: Secondary | ICD-10-CM | POA: Diagnosis not present

## 2019-04-27 ENCOUNTER — Other Ambulatory Visit: Payer: Self-pay

## 2019-04-27 ENCOUNTER — Encounter: Payer: Self-pay | Admitting: *Deleted

## 2019-04-29 NOTE — Discharge Instructions (Signed)

## 2019-04-30 ENCOUNTER — Other Ambulatory Visit: Payer: Self-pay

## 2019-04-30 ENCOUNTER — Other Ambulatory Visit
Admission: RE | Admit: 2019-04-30 | Discharge: 2019-04-30 | Disposition: A | Discharge status: Home / Self Care | Payer: PPO | Source: Ambulatory Visit | Attending: Ophthalmology | Admitting: Ophthalmology

## 2019-04-30 DIAGNOSIS — Z1159 Encounter for screening for other viral diseases: Secondary | ICD-10-CM | POA: Diagnosis not present

## 2019-05-01 LAB — NOVEL CORONAVIRUS, NAA (HOSP ORDER, SEND-OUT TO REF LAB; TAT 18-24 HRS): SARS-CoV-2, NAA: NOT DETECTED

## 2019-05-04 ENCOUNTER — Encounter
Admission: RE | Disposition: A | Discharge status: Home / Self Care | Payer: Self-pay | Source: Home / Self Care | Attending: Ophthalmology

## 2019-05-04 ENCOUNTER — Ambulatory Visit
Admission: RE | Admit: 2019-05-04 | Discharge: 2019-05-04 | Disposition: A | Discharge status: Home / Self Care | Payer: PPO | Attending: Ophthalmology | Admitting: Ophthalmology

## 2019-05-04 ENCOUNTER — Other Ambulatory Visit: Payer: Self-pay

## 2019-05-04 ENCOUNTER — Ambulatory Visit: Payer: PPO | Admitting: Anesthesiology

## 2019-05-04 DIAGNOSIS — J449 Chronic obstructive pulmonary disease, unspecified: Secondary | ICD-10-CM | POA: Diagnosis not present

## 2019-05-04 DIAGNOSIS — H2511 Age-related nuclear cataract, right eye: Secondary | ICD-10-CM | POA: Insufficient documentation

## 2019-05-04 DIAGNOSIS — K219 Gastro-esophageal reflux disease without esophagitis: Secondary | ICD-10-CM | POA: Insufficient documentation

## 2019-05-04 DIAGNOSIS — Z79899 Other long term (current) drug therapy: Secondary | ICD-10-CM | POA: Diagnosis not present

## 2019-05-04 DIAGNOSIS — Z87891 Personal history of nicotine dependence: Secondary | ICD-10-CM | POA: Diagnosis not present

## 2019-05-04 DIAGNOSIS — Z885 Allergy status to narcotic agent status: Secondary | ICD-10-CM | POA: Insufficient documentation

## 2019-05-04 DIAGNOSIS — I10 Essential (primary) hypertension: Secondary | ICD-10-CM | POA: Diagnosis not present

## 2019-05-04 DIAGNOSIS — Z888 Allergy status to other drugs, medicaments and biological substances status: Secondary | ICD-10-CM | POA: Insufficient documentation

## 2019-05-04 DIAGNOSIS — H25811 Combined forms of age-related cataract, right eye: Secondary | ICD-10-CM | POA: Diagnosis not present

## 2019-05-04 HISTORY — PX: CATARACT EXTRACTION W/PHACO: SHX586

## 2019-05-04 SURGERY — PHACOEMULSIFICATION, CATARACT, WITH IOL INSERTION
Anesthesia: Monitor Anesthesia Care | Site: Eye | Laterality: Right

## 2019-05-04 MED ORDER — EPINEPHRINE PF 1 MG/ML IJ SOLN
INTRAOCULAR | Status: DC | PRN
  Administered 2019-05-04: 69 mL via OPHTHALMIC

## 2019-05-04 MED ORDER — MOXIFLOXACIN HCL 0.5 % OP SOLN
OPHTHALMIC | Status: DC | PRN
  Administered 2019-05-04: 0.2 mL via OPHTHALMIC

## 2019-05-04 MED ORDER — TETRACAINE HCL 0.5 % OP SOLN
1.0000 [drp] | OPHTHALMIC | Status: DC | PRN
  Administered 2019-05-04 (×3): 1 [drp] via OPHTHALMIC

## 2019-05-04 MED ORDER — LIDOCAINE HCL (PF) 2 % IJ SOLN
INTRAOCULAR | Status: DC | PRN
  Administered 2019-05-04: 09:00:00 1 mL

## 2019-05-04 MED ORDER — ARMC OPHTHALMIC DILATING DROPS
1.0000 "application " | OPHTHALMIC | Status: DC | PRN
  Administered 2019-05-04 (×3): 1 via OPHTHALMIC

## 2019-05-04 MED ORDER — NA CHONDROIT SULF-NA HYALURON 40-17 MG/ML IO SOLN
INTRAOCULAR | Status: DC | PRN
  Administered 2019-05-04: 1 mL via INTRAOCULAR

## 2019-05-04 MED ORDER — ONDANSETRON HCL 4 MG/2ML IJ SOLN: 4.0000 mg | Freq: Once | INTRAMUSCULAR | Status: DC | PRN

## 2019-05-04 MED ORDER — MIDAZOLAM HCL 2 MG/2ML IJ SOLN
INTRAMUSCULAR | Status: DC | PRN
  Administered 2019-05-04: 2 mg via INTRAVENOUS

## 2019-05-04 MED ORDER — FENTANYL CITRATE (PF) 100 MCG/2ML IJ SOLN
INTRAMUSCULAR | Status: DC | PRN
  Administered 2019-05-04: 50 ug via INTRAVENOUS

## 2019-05-04 MED ORDER — BRIMONIDINE TARTRATE-TIMOLOL 0.2-0.5 % OP SOLN
OPHTHALMIC | Status: DC | PRN
  Administered 2019-05-04: 1 [drp] via OPHTHALMIC

## 2019-05-04 SURGICAL SUPPLY — 21 items
CANNULA ANT/CHMB 27G (MISCELLANEOUS) ×1 IMPLANT
CANNULA ANT/CHMB 27GA (MISCELLANEOUS) ×3 IMPLANT
GLOVE SURG LX 8.0 MICRO (GLOVE) ×4
GLOVE SURG LX STRL 8.0 MICRO (GLOVE) ×1 IMPLANT
GLOVE SURG TRIUMPH 8.0 PF LTX (GLOVE) ×3 IMPLANT
GOWN STRL REUS W/ TWL LRG LVL3 (GOWN DISPOSABLE) ×2 IMPLANT
GOWN STRL REUS W/TWL LRG LVL3 (GOWN DISPOSABLE) ×4
LENS IOL TECNIS ITEC 25.0 (Intraocular Lens) ×2 IMPLANT
MARKER SKIN DUAL TIP RULER LAB (MISCELLANEOUS) ×3 IMPLANT
NDL FILTER BLUNT 18X1 1/2 (NEEDLE) ×1 IMPLANT
NDL RETROBULBAR .5 NSTRL (NEEDLE) ×3 IMPLANT
NEEDLE FILTER BLUNT 18X 1/2SAF (NEEDLE) ×2
NEEDLE FILTER BLUNT 18X1 1/2 (NEEDLE) ×1 IMPLANT
PACK EYE AFTER SURG (MISCELLANEOUS) ×3 IMPLANT
PACK OPTHALMIC (MISCELLANEOUS) ×3 IMPLANT
PACK PORFILIO (MISCELLANEOUS) ×3 IMPLANT
SUT ETHILON 10-0 CS-B-6CS-B-6 (SUTURE)
SUTURE EHLN 10-0 CS-B-6CS-B-6 (SUTURE) IMPLANT
SYR 3ML LL SCALE MARK (SYRINGE) ×3 IMPLANT
SYR TB 1ML LUER SLIP (SYRINGE) ×3 IMPLANT
WIPE NON LINTING 3.25X3.25 (MISCELLANEOUS) ×3 IMPLANT

## 2019-05-04 NOTE — Anesthesia Postprocedure Evaluation (Signed)
Anesthesia Post Note  Patient: Karina Stone  Procedure(s) Performed: CATARACT EXTRACTION PHACO AND INTRAOCULAR LENS PLACEMENT ([facility])  RIGHT (Right Eye)  Patient location during evaluation: PACU Anesthesia Type: MAC Level of consciousness: awake and alert Pain management: pain level controlled Vital Signs Assessment: post-procedure vital signs reviewed and stable Respiratory status: spontaneous breathing, nonlabored ventilation, respiratory function stable and patient connected to nasal cannula oxygen Cardiovascular status: stable and blood pressure returned to baseline Postop Assessment: no apparent nausea or vomiting Anesthetic complications: no    Veda Canning

## 2019-05-04 NOTE — Transfer of Care (Signed)
Immediate Anesthesia Transfer of Care Note  Patient: Karina Stone  Procedure(s) Performed: CATARACT EXTRACTION PHACO AND INTRAOCULAR LENS PLACEMENT ([facility])  RIGHT (Right Eye)  Patient Location: PACU  Anesthesia Type: MAC  Level of Consciousness: awake, alert  and patient cooperative  Airway and Oxygen Therapy: Patient Spontanous Breathing and Patient connected to supplemental oxygen  Post-op Assessment: Post-op Vital signs reviewed, Patient's Cardiovascular Status Stable, Respiratory Function Stable, Patent Airway and No signs of Nausea or vomiting  Post-op Vital Signs: Reviewed and stable  Complications: No apparent anesthesia complications

## 2019-05-04 NOTE — Op Note (Signed)
PREOPERATIVE DIAGNOSIS:  Nuclear sclerotic cataract of the right eye.   POSTOPERATIVE DIAGNOSIS:  CATARACT   OPERATIVE PROCEDURE: Procedure(s): CATARACT EXTRACTION PHACO AND INTRAOCULAR LENS PLACEMENT ([place])  RIGHT   SURGEON:  Birder Robson, MD.   ANESTHESIA:  Anesthesiologist: Veda Canning, MD CRNA: Janna Arch, CRNA  1.      Managed anesthesia care. 2.      0.53ml of Shugarcaine was instilled in the eye following the paracentesis.   COMPLICATIONS:  None.   TECHNIQUE:   Stop and chop   DESCRIPTION OF PROCEDURE:  The patient was examined and consented in the preoperative holding area where the aforementioned topical anesthesia was applied to the right eye and then brought back to the Operating Room where the right eye was prepped and draped in the usual sterile ophthalmic fashion and a lid speculum was placed. A paracentesis was created with the side port blade and the anterior chamber was filled with viscoelastic. A near clear corneal incision was performed with the steel keratome. A continuous curvilinear capsulorrhexis was performed with a cystotome followed by the capsulorrhexis forceps. Hydrodissection and hydrodelineation were carried out with BSS on a blunt cannula. The lens was removed in a stop and chop  technique and the remaining cortical material was removed with the irrigation-aspiration handpiece. The capsular bag was inflated with viscoelastic and the Technis [id number]  lens was placed in the capsular bag without complication. The remaining viscoelastic was removed from the eye with the irrigation-aspiration handpiece. The wounds were hydrated. The anterior chamber was flushed with BSS and the eye was inflated to physiologic pressure. 0.20ml of Vigamox was placed in the anterior chamber. The wounds were found to be water tight. The eye was dressed with Combigan. The patient was given protective glasses to wear throughout the day and a shield with which to sleep tonight. The  patient was also given drops with which to begin a drop regimen today and will follow-up with me in one day. Implant Name Type Inv. Item Serial No. Manufacturer Lot No. LRB No. Used Action  LENS IOL DIOP 25.0 - [id number] Intraocular Lens LENS IOL DIOP 25.0 [id number] AMO  Right 1 Implanted   Procedure(s): CATARACT EXTRACTION PHACO AND INTRAOCULAR LENS PLACEMENT (I[facility])  RIGHT (Right)  Electronically signed: Birder Robson 05/04/2019 9:03 AM

## 2019-05-04 NOTE — H&P (Signed)
All labs reviewed. Abnormal studies sent to patients PCP when indicated.  Previous H&P reviewed, patient examined, there are NO CHANGES.  Karina Leighton Porfilio6/23/20208:24 AM

## 2019-05-04 NOTE — Anesthesia Preprocedure Evaluation (Addendum)
Anesthesia Evaluation  Patient identified by MRN, date of birth, ID band Patient awake    Reviewed: Allergy & Precautions, H&P , NPO status , Patient's Chart, lab work & pertinent test results  Airway Mallampati: II  TM Distance: >3 FB     Dental  (+) Upper Dentures, Lower Dentures   Pulmonary shortness of breath, COPD, former smoker,    breath sounds clear to auscultation       Cardiovascular Exercise Tolerance: Good hypertension (takes regularly at home - 140/80s), Pt. on medications  Rhythm:regular Rate:Normal     Neuro/Psych  Headaches,    GI/Hepatic GERD  ,  Endo/Other  negative endocrine ROS  Renal/GU      Musculoskeletal   Abdominal   Peds  Hematology   Anesthesia Other Findings   Reproductive/Obstetrics                            Anesthesia Physical  Anesthesia Plan  ASA: III  Anesthesia Plan: MAC   Post-op Pain Management:    Induction:   PONV Risk Score and Plan: Midazolam  Airway Management Planned:   Additional Equipment:   Intra-op Plan:   Post-operative Plan:   Informed Consent: I have reviewed the patients History and Physical, chart, labs and discussed the procedure including the risks, benefits and alternatives for the proposed anesthesia with the patient or authorized representative who has indicated his/her understanding and acceptance.       Plan Discussed with: CRNA  Anesthesia Plan Comments:        Anesthesia Quick Evaluation

## 2019-05-04 NOTE — Anesthesia Procedure Notes (Signed)
Procedure Name: MAC Date/Time: 05/04/2019 8:42 AM Performed by: Janna Arch, CRNA Pre-anesthesia Checklist: Patient identified, Emergency Drugs available, Suction available, Timeout performed and Patient being monitored Patient Re-evaluated:Patient Re-evaluated prior to induction Oxygen Delivery Method: Nasal cannula Placement Confirmation: positive ETCO2

## 2019-05-05 ENCOUNTER — Encounter: Payer: Self-pay | Admitting: Ophthalmology

## 2019-05-13 DIAGNOSIS — I10 Essential (primary) hypertension: Secondary | ICD-10-CM | POA: Diagnosis not present

## 2019-05-13 DIAGNOSIS — H2512 Age-related nuclear cataract, left eye: Secondary | ICD-10-CM | POA: Diagnosis not present

## 2019-05-18 NOTE — Discharge Instructions (Signed)

## 2019-05-18 NOTE — Anesthesia Preprocedure Evaluation (Addendum)
Anesthesia Evaluation  Patient identified by MRN, date of birth, ID band Patient awake    Reviewed: Allergy & Precautions, NPO status , Patient's Chart, lab work & pertinent test results  History of Anesthesia Complications Negative for: history of anesthetic complications  Airway Mallampati: II   Neck ROM: Full    Dental  (+) Lower Dentures, Upper Dentures   Pulmonary COPD, former smoker,    Pulmonary exam normal breath sounds clear to auscultation       Cardiovascular hypertension, Normal cardiovascular exam Rhythm:Regular Rate:Normal     Neuro/Psych  Headaches, Vertigo     GI/Hepatic GERD  ,  Endo/Other  negative endocrine ROS  Renal/GU negative Renal ROS     Musculoskeletal  (+) Arthritis ,   Abdominal   Peds  Hematology negative hematology ROS (+)   Anesthesia Other Findings   Reproductive/Obstetrics                           Anesthesia Physical Anesthesia Plan  ASA: III  Anesthesia Plan: MAC   Post-op Pain Management:    Induction: Intravenous  PONV Risk Score and Plan: 2 and TIVA and Midazolam  Airway Management Planned: Natural Airway  Additional Equipment:   Intra-op Plan:   Post-operative Plan:   Informed Consent: I have reviewed the patients History and Physical, chart, labs and discussed the procedure including the risks, benefits and alternatives for the proposed anesthesia with the patient or authorized representative who has indicated his/her understanding and acceptance.       Plan Discussed with: CRNA  Anesthesia Plan Comments:        Anesthesia Quick Evaluation

## 2019-05-21 ENCOUNTER — Other Ambulatory Visit: Payer: Self-pay

## 2019-05-21 ENCOUNTER — Other Ambulatory Visit
Admission: RE | Admit: 2019-05-21 | Discharge: 2019-05-21 | Disposition: A | Discharge status: Home / Self Care | Payer: PPO | Source: Ambulatory Visit | Attending: Ophthalmology | Admitting: Ophthalmology

## 2019-05-21 DIAGNOSIS — Z1159 Encounter for screening for other viral diseases: Secondary | ICD-10-CM | POA: Insufficient documentation

## 2019-05-21 DIAGNOSIS — Z01812 Encounter for preprocedural laboratory examination: Secondary | ICD-10-CM | POA: Diagnosis not present

## 2019-05-22 LAB — SARS CORONAVIRUS 2 (TAT 6-24 HRS): SARS Coronavirus 2: NEGATIVE

## 2019-05-25 ENCOUNTER — Ambulatory Visit: Payer: PPO | Admitting: Anesthesiology

## 2019-05-25 ENCOUNTER — Encounter
Admission: RE | Disposition: A | Discharge status: Home / Self Care | Payer: Self-pay | Source: Home / Self Care | Attending: Ophthalmology

## 2019-05-25 ENCOUNTER — Ambulatory Visit
Admission: RE | Admit: 2019-05-25 | Discharge: 2019-05-25 | Disposition: A | Discharge status: Home / Self Care | Payer: PPO | Attending: Ophthalmology | Admitting: Ophthalmology

## 2019-05-25 DIAGNOSIS — Z87891 Personal history of nicotine dependence: Secondary | ICD-10-CM | POA: Insufficient documentation

## 2019-05-25 DIAGNOSIS — K219 Gastro-esophageal reflux disease without esophagitis: Secondary | ICD-10-CM | POA: Insufficient documentation

## 2019-05-25 DIAGNOSIS — J449 Chronic obstructive pulmonary disease, unspecified: Secondary | ICD-10-CM | POA: Diagnosis not present

## 2019-05-25 DIAGNOSIS — H2512 Age-related nuclear cataract, left eye: Secondary | ICD-10-CM | POA: Diagnosis not present

## 2019-05-25 DIAGNOSIS — Z79899 Other long term (current) drug therapy: Secondary | ICD-10-CM | POA: Diagnosis not present

## 2019-05-25 DIAGNOSIS — I10 Essential (primary) hypertension: Secondary | ICD-10-CM | POA: Insufficient documentation

## 2019-05-25 DIAGNOSIS — H25812 Combined forms of age-related cataract, left eye: Secondary | ICD-10-CM | POA: Diagnosis not present

## 2019-05-25 HISTORY — PX: CATARACT EXTRACTION W/PHACO: SHX586

## 2019-05-25 SURGERY — PHACOEMULSIFICATION, CATARACT, WITH IOL INSERTION
Anesthesia: Monitor Anesthesia Care | Site: Eye | Laterality: Left

## 2019-05-25 MED ORDER — LACTATED RINGERS IV SOLN: INTRAVENOUS | Status: DC

## 2019-05-25 MED ORDER — ONDANSETRON HCL 4 MG/2ML IJ SOLN: 4.0000 mg | Freq: Once | INTRAMUSCULAR | Status: DC | PRN

## 2019-05-25 MED ORDER — ACETAMINOPHEN 160 MG/5ML PO SOLN: 325.0000 mg | ORAL | Status: DC | PRN

## 2019-05-25 MED ORDER — ACETAMINOPHEN 325 MG PO TABS: 650.0000 mg | ORAL_TABLET | Freq: Once | ORAL | Status: DC | PRN

## 2019-05-25 MED ORDER — EPINEPHRINE PF 1 MG/ML IJ SOLN
INTRAOCULAR | Status: DC | PRN
  Administered 2019-05-25: 69 mL via OPHTHALMIC

## 2019-05-25 MED ORDER — FENTANYL CITRATE (PF) 100 MCG/2ML IJ SOLN
INTRAMUSCULAR | Status: DC | PRN
  Administered 2019-05-25: 50 ug via INTRAVENOUS

## 2019-05-25 MED ORDER — BRIMONIDINE TARTRATE-TIMOLOL 0.2-0.5 % OP SOLN
OPHTHALMIC | Status: DC | PRN
  Administered 2019-05-25: 1 [drp] via OPHTHALMIC

## 2019-05-25 MED ORDER — ARMC OPHTHALMIC DILATING DROPS
1.0000 "application " | OPHTHALMIC | Status: DC | PRN
  Administered 2019-05-25 (×3): 1 via OPHTHALMIC

## 2019-05-25 MED ORDER — NA CHONDROIT SULF-NA HYALURON 40-17 MG/ML IO SOLN
INTRAOCULAR | Status: DC | PRN
  Administered 2019-05-25: 1 mL via INTRAOCULAR

## 2019-05-25 MED ORDER — MIDAZOLAM HCL 2 MG/2ML IJ SOLN
INTRAMUSCULAR | Status: DC | PRN
  Administered 2019-05-25: 1.5 mg via INTRAVENOUS

## 2019-05-25 MED ORDER — MOXIFLOXACIN HCL 0.5 % OP SOLN
OPHTHALMIC | Status: DC | PRN
  Administered 2019-05-25: 0.2 mL

## 2019-05-25 MED ORDER — LIDOCAINE HCL (PF) 2 % IJ SOLN
INTRAOCULAR | Status: DC | PRN
  Administered 2019-05-25: 1 mL

## 2019-05-25 MED ORDER — TETRACAINE HCL 0.5 % OP SOLN
1.0000 [drp] | OPHTHALMIC | Status: DC | PRN
  Administered 2019-05-25 (×3): 1 [drp] via OPHTHALMIC

## 2019-05-25 SURGICAL SUPPLY — 19 items

## 2019-05-25 NOTE — Anesthesia Postprocedure Evaluation (Signed)
Anesthesia Post Note  Patient: Karina Stone  Procedure(s) Performed: CATARACT EXTRACTION PHACO AND INTRAOCULAR LENS PLACEMENT (IOC) LEFT (Left Eye)  Patient location during evaluation: PACU Anesthesia Type: MAC Level of consciousness: awake and alert, oriented and patient cooperative Pain management: pain level controlled Vital Signs Assessment: post-procedure vital signs reviewed and stable Respiratory status: spontaneous breathing, nonlabored ventilation and respiratory function stable Cardiovascular status: blood pressure returned to baseline and stable Postop Assessment: adequate PO intake Anesthetic complications: no    Darrin Nipper

## 2019-05-25 NOTE — H&P (Signed)
All labs reviewed. Abnormal studies sent to patients PCP when indicated.  Previous H&P reviewed, patient examined, there are NO CHANGES.  Karina Diggs Porfilio7/14/202012:43 PM

## 2019-05-25 NOTE — Anesthesia Procedure Notes (Signed)
Procedure Name: MAC Performed by: Hailyn Zarr, CRNA Pre-anesthesia Checklist: Patient identified, Emergency Drugs available, Suction available, Timeout performed and Patient being monitored Patient Re-evaluated:Patient Re-evaluated prior to induction Oxygen Delivery Method: Nasal cannula Placement Confirmation: positive ETCO2       

## 2019-05-25 NOTE — Op Note (Signed)
PREOPERATIVE DIAGNOSIS:  Nuclear sclerotic cataract of the left eye.   POSTOPERATIVE DIAGNOSIS:  Nuclear sclerotic cataract of the left eye.   OPERATIVE PROCEDURE: Procedure(s): CATARACT EXTRACTION PHACO AND INTRAOCULAR LENS PLACEMENT (IOC) LEFT   SURGEON:  Birder Robson, MD.   ANESTHESIA:  Anesthesiologist: Darrin Nipper, MD CRNA: Mayme Genta, CRNA  1.      Managed anesthesia care. 2.     0.37ml of Shugarcaine was instilled following the paracentesis   COMPLICATIONS:  None.   TECHNIQUE:   Stop and chop   DESCRIPTION OF PROCEDURE:  The patient was examined and consented in the preoperative holding area where the aforementioned topical anesthesia was applied to the left eye and then brought back to the Operating Room where the left eye was prepped and draped in the usual sterile ophthalmic fashion and a lid speculum was placed. A paracentesis was created with the side port blade and the anterior chamber was filled with viscoelastic. A near clear corneal incision was performed with the steel keratome. A continuous curvilinear capsulorrhexis was performed with a cystotome followed by the capsulorrhexis forceps. Hydrodissection and hydrodelineation were carried out with BSS on a blunt cannula. The lens was removed in a stop and chop  technique and the remaining cortical material was removed with the irrigation-aspiration handpiece. The capsular bag was inflated with viscoelastic and the Technis [id number] lens was placed in the capsular bag without complication. The remaining viscoelastic was removed from the eye with the irrigation-aspiration handpiece. The wounds were hydrated. The anterior chamber was flushed with BSS and the eye was inflated to physiologic pressure. 0.61ml Vigamox was placed in the anterior chamber. The wounds were found to be water tight. The eye was dressed with [place]. The patient was given protective glasses to wear throughout the day and a shield with which to sleep  tonight. The patient was also given drops with which to begin a drop regimen today and will follow-up with me in one day. Implant Name Type Inv. Item Serial No. Manufacturer Lot No. LRB No. Used Action  LENS IOL DIOP 25.0 - [id number] Intraocular Lens LENS IOL DIOP 25.0 [id number] AMO  Left 1 Implanted    Procedure(s): CATARACT EXTRACTION PHACO AND INTRAOCULAR LENS PLACEMENT (IOC) LEFT (Left)  Electronically signed: Birder Robson 05/25/2019 1:11 PM

## 2019-05-25 NOTE — Transfer of Care (Signed)
Immediate Anesthesia Transfer of Care Note  Patient: Karina Stone  Procedure(s) Performed: CATARACT EXTRACTION PHACO AND INTRAOCULAR LENS PLACEMENT (IOC) LEFT (Left Eye)  Patient Location: PACU  Anesthesia Type: MAC  Level of Consciousness: awake, alert  and patient cooperative  Airway and Oxygen Therapy: Patient Spontanous Breathing and Patient connected to supplemental oxygen  Post-op Assessment: Post-op Vital signs reviewed, Patient's Cardiovascular Status Stable, Respiratory Function Stable, Patent Airway and No signs of Nausea or vomiting  Post-op Vital Signs: Reviewed and stable  Complications: No apparent anesthesia complications

## 2019-05-26 ENCOUNTER — Encounter: Payer: Self-pay | Admitting: Ophthalmology

## 2019-06-09 ENCOUNTER — Encounter: Payer: Self-pay | Admitting: Emergency Medicine

## 2019-06-09 ENCOUNTER — Other Ambulatory Visit: Payer: Self-pay

## 2019-06-09 ENCOUNTER — Emergency Department: Payer: PPO

## 2019-06-09 DIAGNOSIS — Z885 Allergy status to narcotic agent status: Secondary | ICD-10-CM | POA: Insufficient documentation

## 2019-06-09 DIAGNOSIS — R42 Dizziness and giddiness: Secondary | ICD-10-CM | POA: Diagnosis not present

## 2019-06-09 DIAGNOSIS — J449 Chronic obstructive pulmonary disease, unspecified: Secondary | ICD-10-CM | POA: Diagnosis not present

## 2019-06-09 DIAGNOSIS — Z79899 Other long term (current) drug therapy: Secondary | ICD-10-CM | POA: Diagnosis not present

## 2019-06-09 DIAGNOSIS — E876 Hypokalemia: Secondary | ICD-10-CM | POA: Diagnosis not present

## 2019-06-09 DIAGNOSIS — R0902 Hypoxemia: Secondary | ICD-10-CM | POA: Diagnosis not present

## 2019-06-09 DIAGNOSIS — R531 Weakness: Secondary | ICD-10-CM | POA: Diagnosis not present

## 2019-06-09 DIAGNOSIS — I10 Essential (primary) hypertension: Secondary | ICD-10-CM | POA: Insufficient documentation

## 2019-06-09 DIAGNOSIS — R4182 Altered mental status, unspecified: Secondary | ICD-10-CM | POA: Diagnosis not present

## 2019-06-09 DIAGNOSIS — Z87891 Personal history of nicotine dependence: Secondary | ICD-10-CM | POA: Insufficient documentation

## 2019-06-09 DIAGNOSIS — N390 Urinary tract infection, site not specified: Secondary | ICD-10-CM | POA: Insufficient documentation

## 2019-06-09 DIAGNOSIS — Z888 Allergy status to other drugs, medicaments and biological substances status: Secondary | ICD-10-CM | POA: Diagnosis not present

## 2019-06-09 LAB — BASIC METABOLIC PANEL
Anion gap: 12 (ref 5–15)
BUN: 20 mg/dL (ref 8–23)
CO2: 27 mmol/L (ref 22–32)
Calcium: 9.1 mg/dL (ref 8.9–10.3)
Chloride: 100 mmol/L (ref 98–111)
Creatinine, Ser: 0.91 mg/dL (ref 0.44–1.00)
GFR calc Af Amer: >60 mL/min (ref >60)
GFR calc non Af Amer: 60 mL/min — ABNORMAL LOW (ref >60)
Glucose, Bld: 164 mg/dL — ABNORMAL HIGH (ref 70–99)
Potassium: 3 mmol/L — ABNORMAL LOW (ref 3.5–5.1)
Sodium: 139 mmol/L (ref 135–145)

## 2019-06-09 LAB — CBC
HCT: 39.6 % (ref 36.0–46.0)
Hemoglobin: 12.8 g/dL (ref 12.0–15.0)
MCH: 30.6 pg (ref 26.0–34.0)
MCHC: 32.3 g/dL (ref 30.0–36.0)
MCV: 94.7 fL (ref 80.0–100.0)
Platelets: 320 10*3/uL (ref 150–400)
RBC: 4.18 MIL/uL (ref 3.87–5.11)
RDW: 12.3 % (ref 11.5–15.5)
WBC: 14.6 10*3/uL — ABNORMAL HIGH (ref 4.0–10.5)
nRBC: 0 % (ref 0.0–0.2)

## 2019-06-09 LAB — TROPONIN I (HIGH SENSITIVITY): Troponin I (High Sensitivity): 8 ng/L (ref <18)

## 2019-06-09 NOTE — ED Triage Notes (Signed)
Pt arrives via ems from home. Pt reports not feeling well all week but today feeling "worse." PT states she has intermittent isses with IBS and today frequently in and out of the bathroom. Pt went to the bathroom tonight and family was concerned because pt "was staring off" when she returned from the bathroom. Episode only lasted a few second and pt was back to her baseline; this happened at approximately 2030. Pt reports she want just cold and when she got warm she felt better. Pt reports she felt like "the voices were in a distance." Pt arrives in triage a& o x 4.

## 2019-06-10 ENCOUNTER — Emergency Department: Payer: PPO

## 2019-06-10 ENCOUNTER — Emergency Department
Admission: EM | Admit: 2019-06-10 | Discharge: 2019-06-10 | Disposition: A | Discharge status: Home / Self Care | Payer: PPO | Attending: Emergency Medicine | Admitting: Emergency Medicine

## 2019-06-10 DIAGNOSIS — N390 Urinary tract infection, site not specified: Secondary | ICD-10-CM

## 2019-06-10 DIAGNOSIS — R531 Weakness: Secondary | ICD-10-CM

## 2019-06-10 DIAGNOSIS — E876 Hypokalemia: Secondary | ICD-10-CM

## 2019-06-10 LAB — URINALYSIS, COMPLETE (UACMP) WITH MICROSCOPIC
Bacteria, UA: NONE SEEN
Bilirubin Urine: NEGATIVE
Glucose, UA: NEGATIVE mg/dL
Hgb urine dipstick: NEGATIVE
Ketones, ur: NEGATIVE mg/dL
Nitrite: NEGATIVE
Protein, ur: NEGATIVE mg/dL
Specific Gravity, Urine: 1.017 (ref 1.005–1.030)
pH: 5 (ref 5.0–8.0)

## 2019-06-10 LAB — TROPONIN I (HIGH SENSITIVITY): Troponin I (High Sensitivity): 5 ng/L (ref <18)

## 2019-06-10 MED ORDER — CEPHALEXIN 250 MG PO CAPS
250.0000 mg | ORAL_CAPSULE | Freq: Once | ORAL | Status: AC
  Administered 2019-06-10: 250 mg via ORAL
  Filled 2019-06-10: qty 1

## 2019-06-10 MED ORDER — SODIUM CHLORIDE 0.9 % IV SOLN
1.0000 g | Freq: Once | INTRAVENOUS | Status: DC
  Filled 2019-06-10: qty 10

## 2019-06-10 MED ORDER — POTASSIUM CHLORIDE CRYS ER 20 MEQ PO TBCR
40.0000 meq | EXTENDED_RELEASE_TABLET | Freq: Once | ORAL | Status: AC
  Administered 2019-06-10: 02:00:00 40 meq via ORAL
  Filled 2019-06-10: qty 2

## 2019-06-10 MED ORDER — CEPHALEXIN 250 MG PO CAPS: 250.0000 mg | ORAL_CAPSULE | Freq: Three times a day (TID) | ORAL | 0 refills | Status: AC

## 2019-06-10 NOTE — Discharge Instructions (Addendum)
1.  Take antibiotic as prescribed (Keflex). 2.  Drink plenty of fluids daily. 3.  Return to the ER for worsening symptoms, persistent vomiting, difficulty breathing or other concerns.

## 2019-06-10 NOTE — ED Provider Notes (Signed)
[facility] Emergency Department Provider Note   ____________________________________________   First MD Initiated Contact with Patient 06/10/19 0136     (approximate)  I have reviewed the triage vital signs and the nursing notes.   HISTORY  Chief Complaint Weakness    HPI Karina Stone is a 78 y.o. female brought to the ED from home via EMS with a chief complaint of generalized weakness.  Patient reports she has IBS and at least 1 day a week she has issues with diarrhea.  She did have bouts of diarrhea today and went frequently in and out of the restroom.  Daughter was concerned because when she was in the restroom, patient was "staring off" when she returned to the restroom.  Patient states she heard her daughter talking to her and thought she was responding.  Episode lasted a few seconds and patient was back to her baseline.  Patient also endorses being cold during the episode but warmed up immediately with a sip of hot coffee.  Denies recent fever, cough, chest pain, shortness of breath, nausea or vomiting.  Denies recent travel, trauma or exposure to persons diagnosed with coronavirus.        Past Medical History:  Diagnosis Date  . Arthritis    finger  . Chronic diarrhea   . Colon polyps   . COPD (chronic obstructive pulmonary disease) ([place])   . Diverticulosis   . Dyspnea   . GERD (gastroesophageal reflux disease)   . Headache   . Hypertension   . Thoracic scoliosis   . Vertigo   . Wears dentures    full upper and lower    Patient Active Problem List   Diagnosis Date Noted  . Acute diverticulitis 11/10/2018    Past Surgical History:  Procedure Laterality Date  . ABDOMINAL HYSTERECTOMY    . BROW LIFT Bilateral 02/11/2017   Procedure: bilateral secondary closure surgical wound dehisence extensive;  Surgeon: Karle Starch, MD;  Location: [facility];  Service: Ophthalmology;  Laterality: Bilateral;  . CATARACT EXTRACTION W/PHACO  Right 05/04/2019   Procedure: CATARACT EXTRACTION PHACO AND INTRAOCULAR LENS PLACEMENT ([place])  RIGHT;  Surgeon: Birder Robson, MD;  Location: [facility];  Service: Ophthalmology;  Laterality: Right;  . CATARACT EXTRACTION W/PHACO Left 05/25/2019   Procedure: CATARACT EXTRACTION PHACO AND INTRAOCULAR LENS PLACEMENT ([place]) LEFT;  Surgeon: Birder Robson, MD;  Location: [facility];  Service: Ophthalmology;  Laterality: Left;  . HERNIA REPAIR      Prior to Admission medications   Medication Sig Start Date End Date Taking? Authorizing Provider  acetaminophen (TYLENOL) 325 MG tablet Take 650 mg by mouth every 6 (six) hours as needed for mild pain or moderate pain.     [provider]  atenolol-chlorthalidone (TENORETIC 50) 50-25 MG tablet Take 1 tablet by mouth daily.    [provider]  cephALEXin (KEFLEX) 250 MG capsule Take 1 capsule (250 mg total) by mouth 3 (three) times daily. 06/10/19   Paulette Blanch, MD  cholecalciferol (VITAMIN D) 1000 units tablet Take 2,000 Units by mouth daily.    [provider]  meclizine (ANTIVERT) 25 MG tablet Take 25 mg by mouth 3 (three) times daily as needed for dizziness.    [provider]  omeprazole (PRILOSEC) 20 MG capsule Take 20 mg by mouth daily.    [provider]  traMADol (ULTRAM) 50 MG tablet Take 50 mg by mouth 2 (two) times daily as needed for moderate  pain.     [provider]  vitamin B-12 (CYANOCOBALAMIN) 1000 MCG tablet Take 1,000 mcg by mouth daily.    [provider]    Allergies Codeine and Amitriptyline  Family History  Problem Relation Age of Onset  . Breast cancer Neg Hx     Social History Social History   Tobacco Use  . Smoking status: Former Smoker    Quit date: 11/12/1991    Years since quitting: 27.5  . Smokeless tobacco: Never Used  Substance Use Topics  . Alcohol use: No  . Drug use: No    Review of Systems  Constitutional: Positive for  being cold.  No fever/chills Eyes: No visual changes. ENT: No sore throat. Cardiovascular: Denies chest pain. Respiratory: Denies shortness of breath. Gastrointestinal: No abdominal pain.  No nausea, no vomiting.  Positive for diarrhea.  No constipation. Genitourinary: Negative for dysuria. Musculoskeletal: Negative for back pain. Skin: Negative for rash. Neurological: Positive for "staring episode".  Negative for headaches, focal weakness or numbness.   ____________________________________________   PHYSICAL EXAM:  VITAL SIGNS: ED Triage Vitals  Enc Vitals Group     BP 06/09/19 2213 (!) 160/79     Pulse Rate 06/09/19 2213 68     Resp 06/09/19 2213 18     Temp 06/09/19 2213 99.2 F (37.3 C)     Temp Source 06/09/19 2213 Oral     SpO2 06/09/19 2213 93 %     Weight 06/09/19 2211 135 lb (61.2 kg)     Height 06/09/19 2211 5\' 5"  (1.651 m)     Head Circumference --      Peak Flow --      Pain Score 06/09/19 2210 0     Pain Loc --      Pain Edu? --      Excl. in [place]? --     Constitutional: Alert and oriented. Well appearing and in no acute distress. Eyes: Conjunctivae are normal. PERRL. EOMI. Head: Atraumatic. Nose: No congestion/rhinnorhea. Mouth/Throat: Mucous membranes are moist.  Oropharynx non-erythematous. Neck: No stridor.   Cardiovascular: Normal rate, regular rhythm. Grossly normal heart sounds.  Good peripheral circulation. Respiratory: Normal respiratory effort.  No retractions. Lungs CTAB. Gastrointestinal: Soft and nontender to light or deep palpation. No distention. No abdominal bruits. No CVA tenderness. Musculoskeletal: No lower extremity tenderness nor edema.  No joint effusions. Neurologic: Alert and oriented x3.  CN II-XII grossly intact.  Normal speech and language. No gross focal neurologic deficits are appreciated. No gait instability. Skin:  Skin is warm, dry and intact. No rash noted.  No petechiae. Psychiatric: Mood and affect are normal. Speech and  behavior are normal.  ____________________________________________   LABS (all labs ordered are listed, but only abnormal results are displayed)  Labs Reviewed  BASIC METABOLIC PANEL - Abnormal; Notable for the following components:      Result Value   Potassium 3.0 (*)    Glucose, Bld 164 (*)    GFR calc non Af Amer 60 (*)    All other components within normal limits  CBC - Abnormal; Notable for the following components:   WBC 14.6 (*)    All other components within normal limits  URINALYSIS, COMPLETE (UACMP) WITH MICROSCOPIC - Abnormal; Notable for the following components:   Color, Urine YELLOW (*)    APPearance CLEAR (*)    Leukocytes,Ua MODERATE (*)    All other components within normal limits  URINE CULTURE  TROPONIN I (HIGH SENSITIVITY)  TROPONIN I (  HIGH SENSITIVITY)   ____________________________________________  EKG  ED ECG REPORT I, Yehudit Fulginiti J, the attending physician, personally viewed and interpreted this ECG.   Date: 06/10/2019  EKG Time: 2221  Rate: 70  Rhythm: normal EKG, normal sinus rhythm  Axis: Normal  Intervals:none  ST&T Change: Nonspecific  ____________________________________________  RADIOLOGY  ED MD interpretation: No ICH, no acute cardiopulmonary process  Official radiology report(s): Ct Head Wo Contrast  Result Date: 06/09/2019 CLINICAL DATA:  Altered mental status, weakness EXAM: CT HEAD WITHOUT CONTRAST TECHNIQUE: Contiguous axial images were obtained from the base of the skull through the vertex without intravenous contrast. COMPARISON:  09/08/2017 FINDINGS: Brain: No evidence of acute infarction, hemorrhage, hydrocephalus, extra-axial collection or mass lesion/mass effect. Mild subcortical white matter and periventricular small vessel ischemic changes. Vascular: Intracranial atherosclerosis. Skull: Normal. Negative for fracture or focal lesion. Sinuses/Orbits: The visualized paranasal sinuses are essentially clear. The mastoid air  cells are unopacified. Other: None. IMPRESSION: No evidence of acute intracranial abnormality. Mild small vessel ischemic changes. Electronically Signed   By: Julian Hy M.D.   On: 06/09/2019 22:52   Dg Chest Port 1 View  Result Date: 06/10/2019 CLINICAL DATA:  Weakness EXAM: PORTABLE CHEST 1 VIEW COMPARISON:  CTA chest dated 09/18/2011 FINDINGS: Prominent epicardial fat at the lung bases. No frank interstitial edema, focal consolidation, or definite pleural effusions. No pneumothorax. The heart is normal in size.  Thoracic aortic atherosclerosis. IMPRESSION: No evidence of acute cardiopulmonary disease. Thoracic aortic atherosclerosis. Electronically Signed   By: Julian Hy M.D.   On: 06/10/2019 01:34    ____________________________________________   PROCEDURES  Procedure(s) performed (including Critical Care):  Procedures   ____________________________________________   INITIAL IMPRESSION / ASSESSMENT AND PLAN / ED COURSE  As part of my medical decision making, I reviewed the following data within the [place] notes reviewed and incorporated, Labs reviewed, EKG interpreted, Old chart reviewed, Radiograph reviewed and Notes from prior ED visits     Karina Stone was evaluated in Emergency Department on 06/10/2019 for the symptoms described in the history of present illness. She was evaluated in the context of the global COVID-19 pandemic, which necessitated consideration that the patient might be at risk for infection with the SARS-CoV-2 virus that causes COVID-19. Institutional protocols and algorithms that pertain to the evaluation of patients at risk for COVID-19 are in a state of rapid change based on information released by regulatory bodies including the CDC and federal and state organizations. These policies and algorithms were followed during the patient's care in the ED.   78 year old female who presents with generalized weakness, diarrhea  and "staring episode".  Differential diagnosis includes but is not limited to CVA, TIA, ACS, orthostasis, dehydration.  Will obtain orthostatic vital signs, repeat troponin, check urinalysis.  Noted lab work, CT head and chest x-ray results.  Will replete potassium orally.   Clinical Course as of Jun 09 240  Thu Jun 10, 2019  0238 Updated patient of repeat troponin and urinalysis. Will start Keflex and patient will follow-up with her PCP closely.  Strict return precautions given.  Patient verbalizes understanding agrees with plan of care.   [JS]    Clinical Course User Index [JS] Paulette Blanch, MD     ____________________________________________   FINAL CLINICAL IMPRESSION(S) / ED DIAGNOSES  Final diagnoses:  Generalized weakness  Lower urinary tract infectious disease  Hypokalemia     ED Discharge Orders         Ordered  cephALEXin (KEFLEX) 250 MG capsule  3 times daily     06/10/19 0240           Note:  This document was prepared using Dragon voice recognition software and may include unintentional dictation errors.   Paulette Blanch, MD 06/10/19 [phone]

## 2019-06-10 NOTE — ED Notes (Signed)
Topaz pad not working in room but this RN reviewed discharge instructions and prescriptions with pt. Pt verbalized understanding.

## 2019-06-11 LAB — URINE CULTURE

## 2019-07-15 DIAGNOSIS — E782 Mixed hyperlipidemia: Secondary | ICD-10-CM | POA: Diagnosis not present

## 2019-07-15 DIAGNOSIS — E538 Deficiency of other specified B group vitamins: Secondary | ICD-10-CM | POA: Diagnosis not present

## 2019-07-15 DIAGNOSIS — Z79899 Other long term (current) drug therapy: Secondary | ICD-10-CM | POA: Diagnosis not present

## 2019-07-22 DIAGNOSIS — E782 Mixed hyperlipidemia: Secondary | ICD-10-CM | POA: Diagnosis not present

## 2019-07-22 DIAGNOSIS — E538 Deficiency of other specified B group vitamins: Secondary | ICD-10-CM | POA: Diagnosis not present

## 2019-07-22 DIAGNOSIS — Z0000 Encounter for general adult medical examination without abnormal findings: Secondary | ICD-10-CM | POA: Diagnosis not present

## 2019-07-22 DIAGNOSIS — I739 Peripheral vascular disease, unspecified: Secondary | ICD-10-CM | POA: Diagnosis not present

## 2019-08-17 DIAGNOSIS — B353 Tinea pedis: Secondary | ICD-10-CM | POA: Diagnosis not present

## 2019-08-17 DIAGNOSIS — M79674 Pain in right toe(s): Secondary | ICD-10-CM | POA: Diagnosis not present

## 2019-08-17 DIAGNOSIS — M79675 Pain in left toe(s): Secondary | ICD-10-CM | POA: Diagnosis not present

## 2019-08-17 DIAGNOSIS — B351 Tinea unguium: Secondary | ICD-10-CM | POA: Diagnosis not present

## 2019-09-12 IMAGING — CT CT HEAD WITHOUT CONTRAST
3 series · 15 of 45 positions shown, 18 images · non-contrast
Comparison: 09/08/2017

CLINICAL DATA: Altered mental status, weakness

EXAM:
CT HEAD WITHOUT CONTRAST
TECHNIQUE: Contiguous axial images were obtained from the base of the skull
through the vertex without intravenous contrast.

[Series 2: head wo · axial · 0.41mm/px · z∈[+370,+485]mm · 9 of 28 slices shown, 12 images]
[im 3/28  brain]
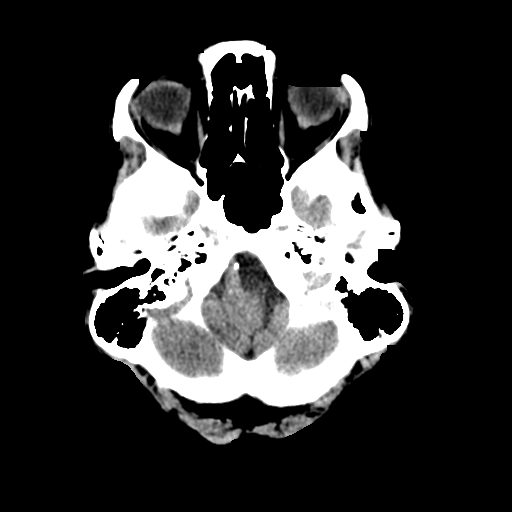
[im 3/28  bone]
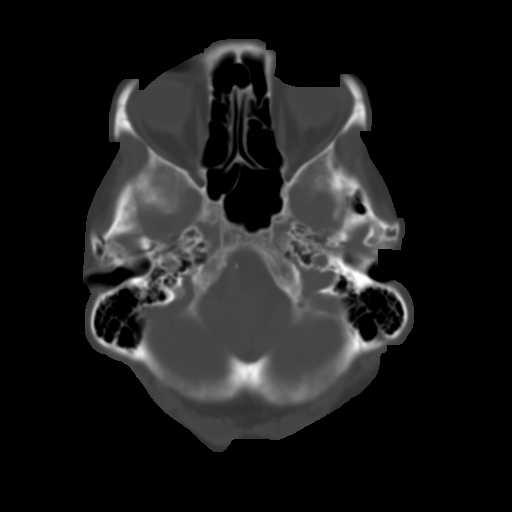
[im 6/28  brain]
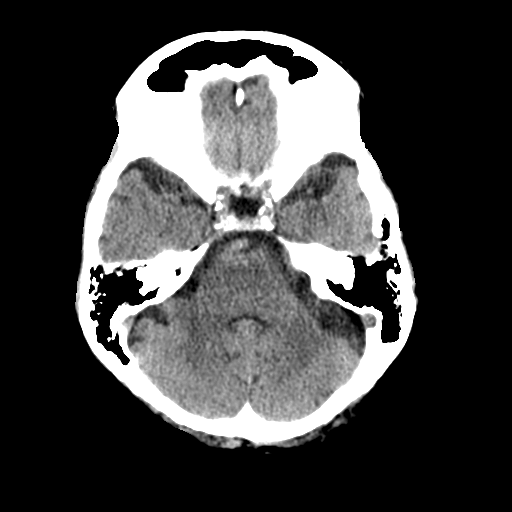
[im 9/28  brain]
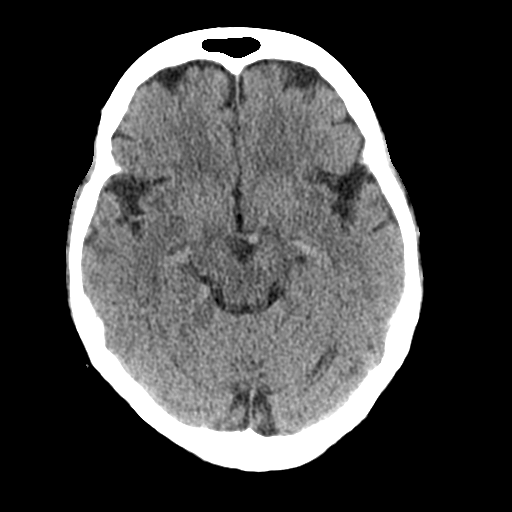
[im 12/28  brain]
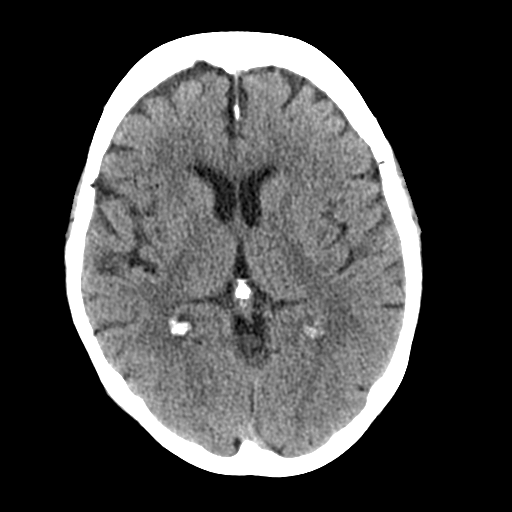
[im 15/28  brain]
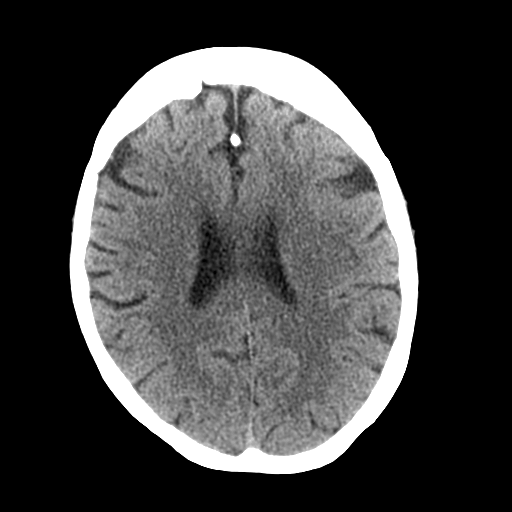
[im 15/28  bone]
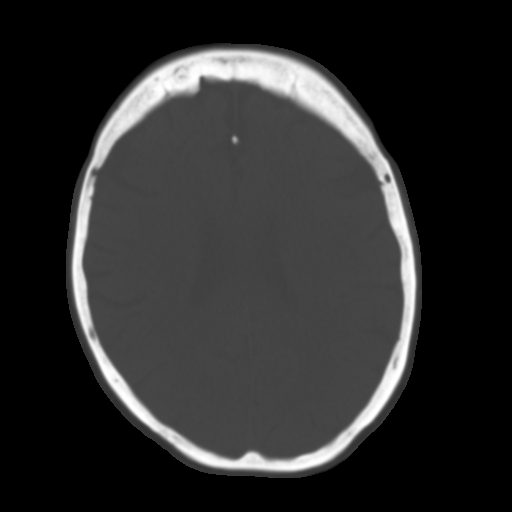
[im 17/28  brain]
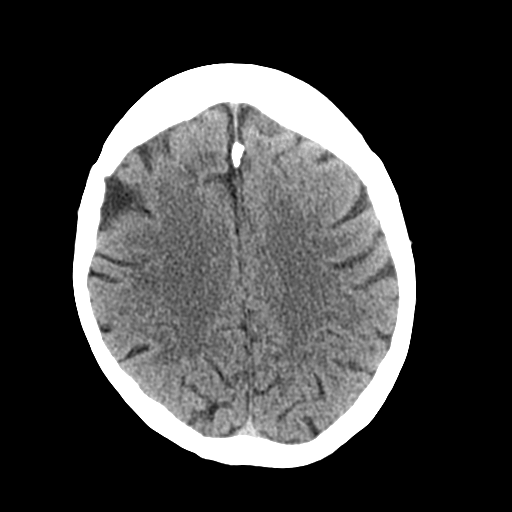
[im 20/28  brain]
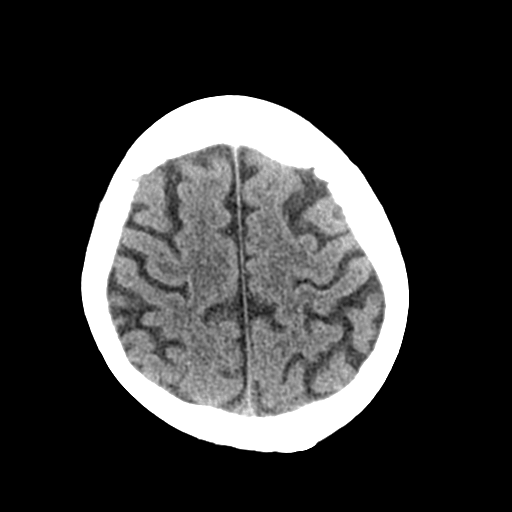
[im 23/28  brain]
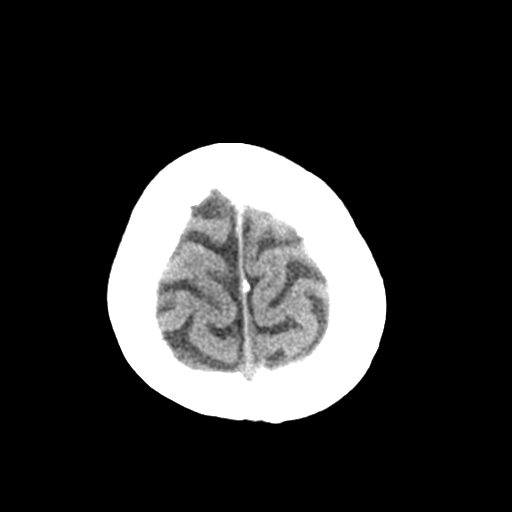
[im 26/28  brain]
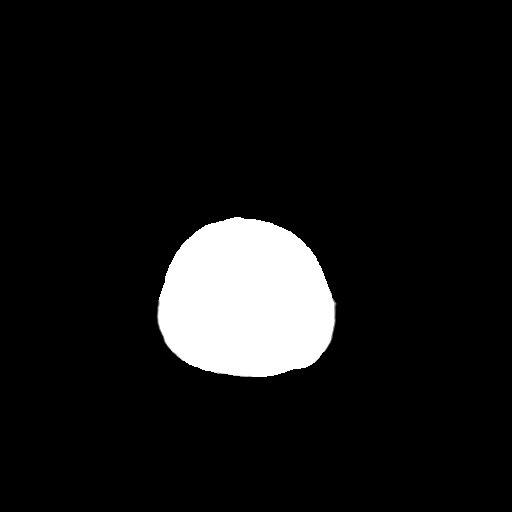
[im 26/28  bone]
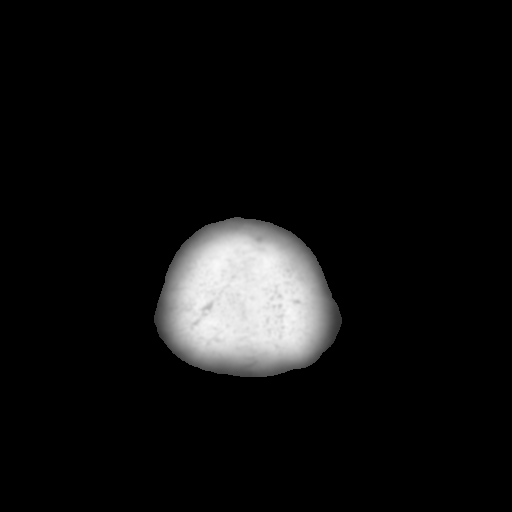

[Series 4: coronal soft tissue · coronal · 0.30mm/px · 3 of 64 slices shown]
[im 22/64  brain]
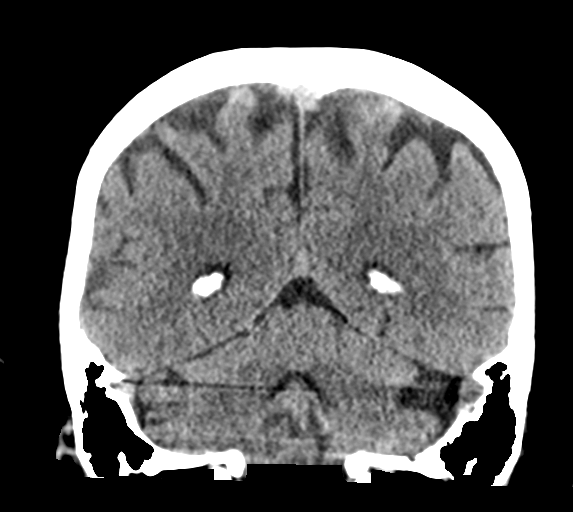
[im 29/64  brain]
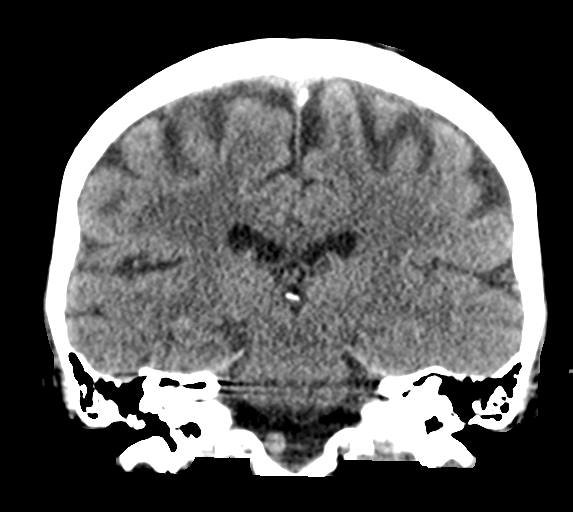
[im 36/64  brain]
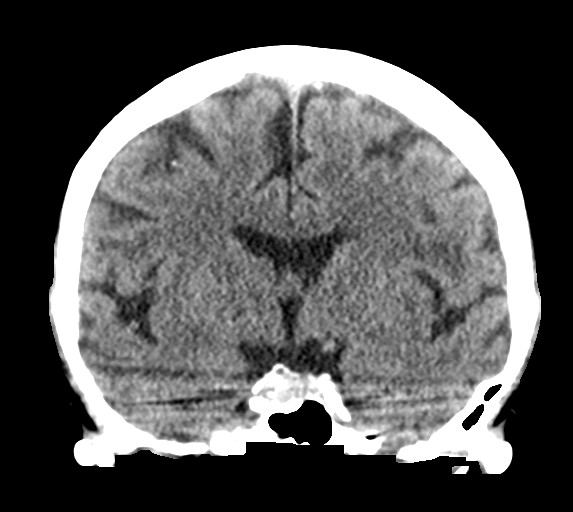

[Series 5: sagittal soft tissue · sagittal · 0.30mm/px · 3 of 52 slices shown]
[im 18/52  brain]
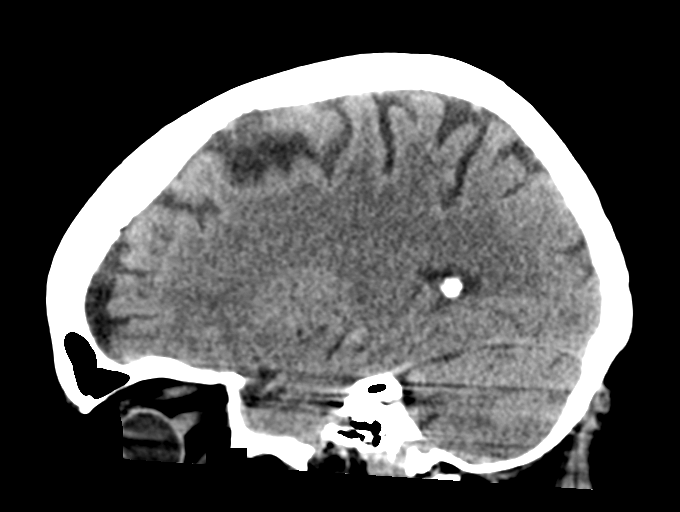
[im 26/52  brain]
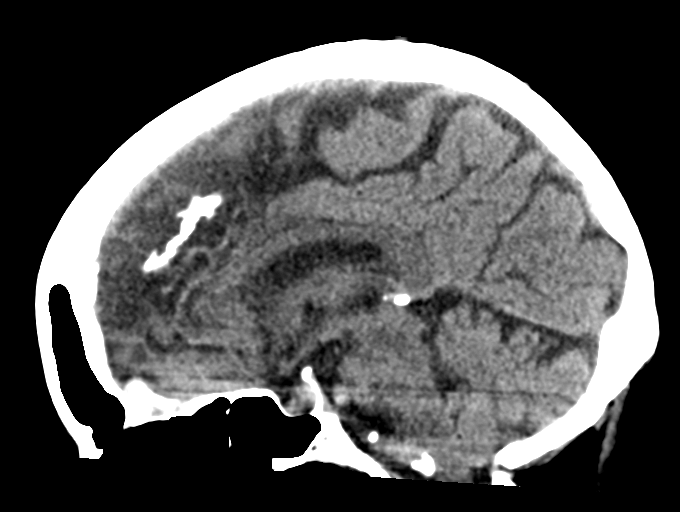
[im 35/52  brain]
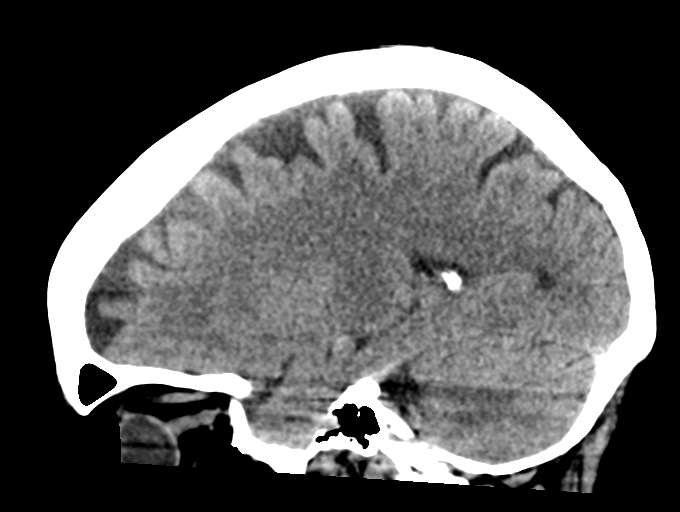

[15 of 45 positions shown; findings below may reference images not displayed]

FINDINGS: Brain: No evidence of acute infarction, hemorrhage, hydrocephalus,
extra-axial collection or mass lesion/mass effect.

Mild subcortical white matter and periventricular small vessel
ischemic changes.

Vascular: Intracranial atherosclerosis.

Skull: Normal. Negative for fracture or focal lesion.

Sinuses/Orbits: The visualized paranasal sinuses are essentially
clear. The mastoid air cells are unopacified.

Other: None.
IMPRESSION: No evidence of acute intracranial abnormality.

Mild small vessel ischemic changes.

## 2019-09-13 IMAGING — DX PORTABLE CHEST - 1 VIEW
1 series · 1 of 1 positions shown · non-contrast
Comparison: CTA chest dated 09/18/2011

CLINICAL DATA: Weakness

EXAM:
PORTABLE CHEST 1 VIEW

[chest ap]
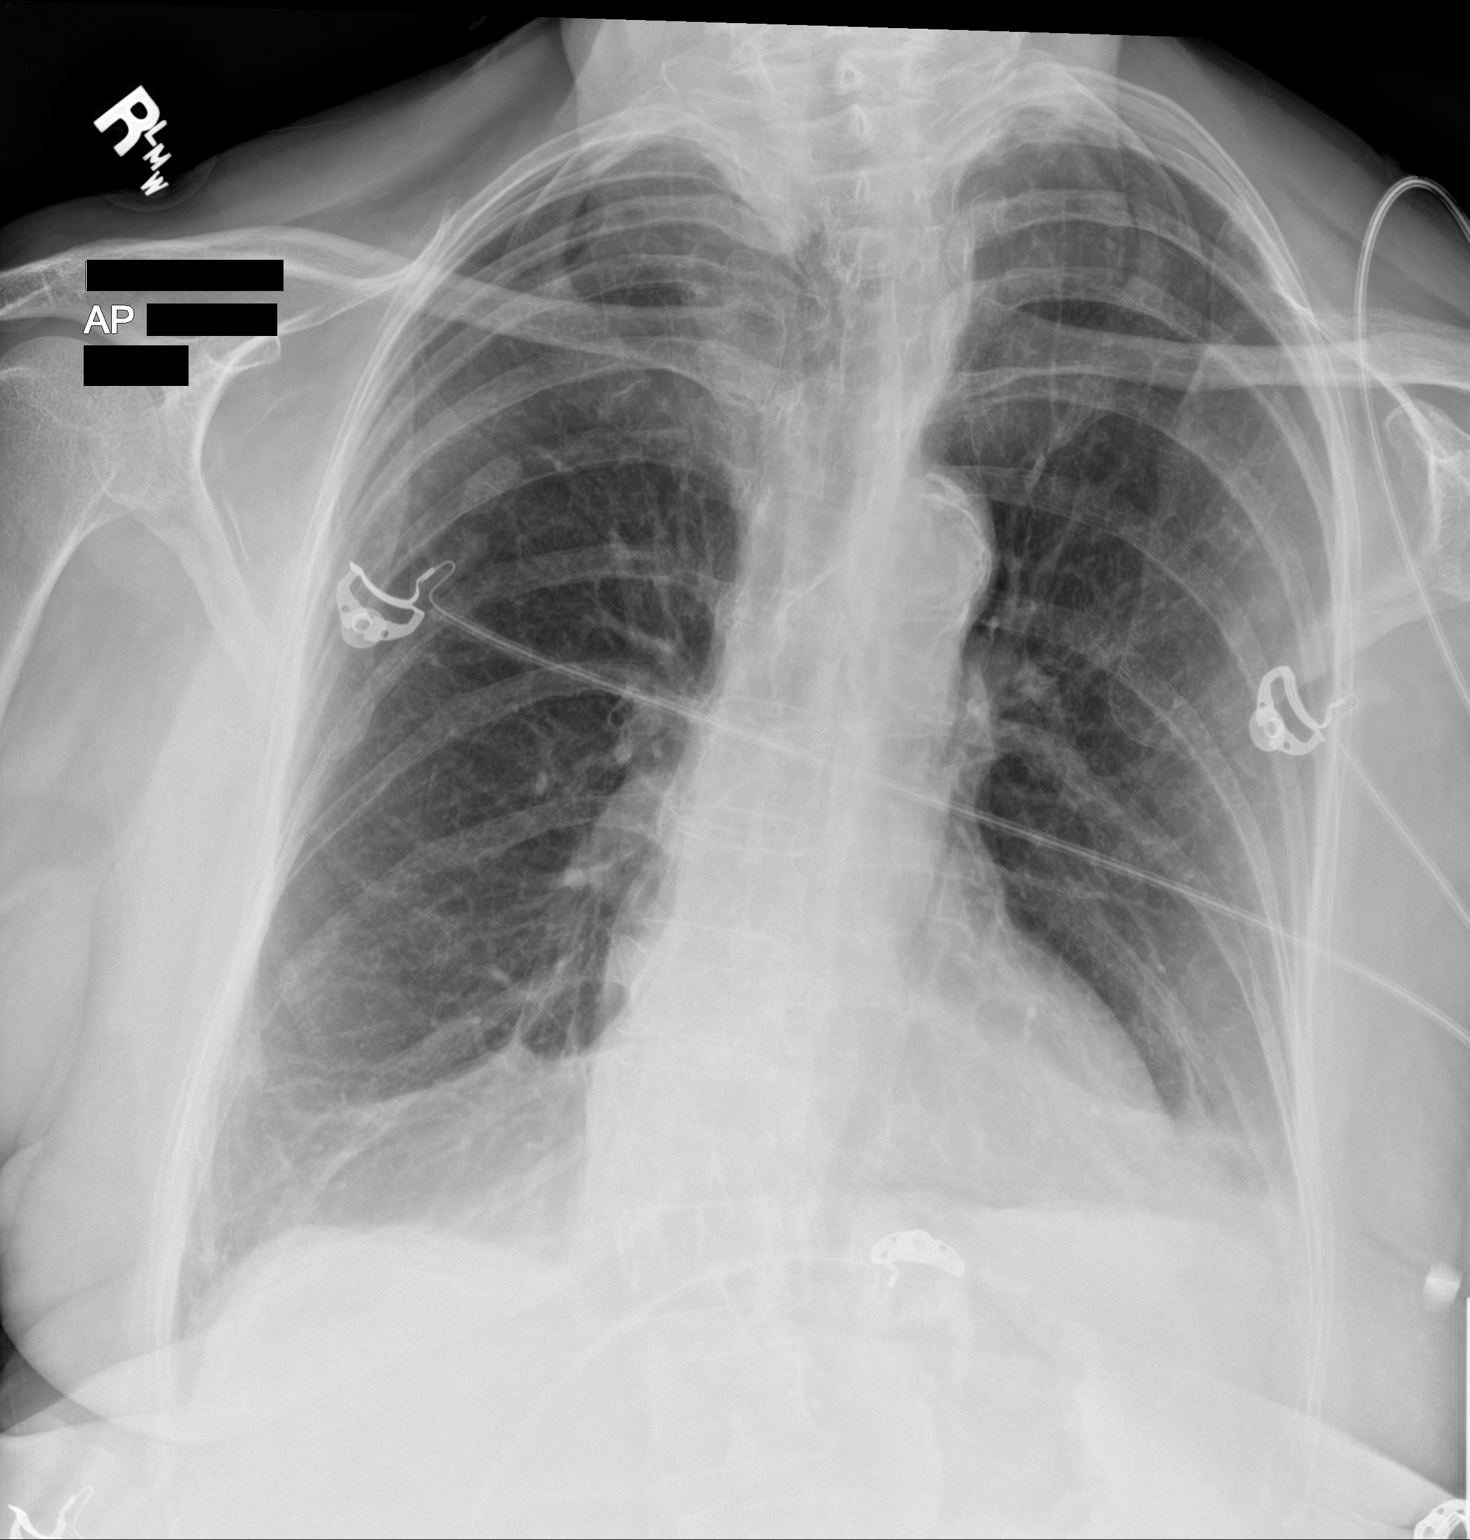

[1 of 1 positions shown; findings below may reference images not displayed]

FINDINGS: Prominent epicardial fat at the lung bases. No frank interstitial
edema, focal consolidation, or definite pleural effusions. No
pneumothorax.

The heart is normal in size.  Thoracic aortic atherosclerosis.
IMPRESSION: No evidence of acute cardiopulmonary disease.

Thoracic aortic atherosclerosis.

## 2019-11-17 DIAGNOSIS — R159 Full incontinence of feces: Secondary | ICD-10-CM | POA: Diagnosis not present

## 2019-11-17 DIAGNOSIS — I739 Peripheral vascular disease, unspecified: Secondary | ICD-10-CM | POA: Diagnosis not present

## 2019-12-01 DIAGNOSIS — K580 Irritable bowel syndrome with diarrhea: Secondary | ICD-10-CM | POA: Diagnosis not present

## 2019-12-01 DIAGNOSIS — Z0000 Encounter for general adult medical examination without abnormal findings: Secondary | ICD-10-CM | POA: Diagnosis not present

## 2019-12-01 DIAGNOSIS — I739 Peripheral vascular disease, unspecified: Secondary | ICD-10-CM | POA: Diagnosis not present

## 2020-03-22 DIAGNOSIS — H43813 Vitreous degeneration, bilateral: Secondary | ICD-10-CM | POA: Diagnosis not present

## 2020-07-18 DIAGNOSIS — E782 Mixed hyperlipidemia: Secondary | ICD-10-CM | POA: Diagnosis not present

## 2020-07-24 DIAGNOSIS — E538 Deficiency of other specified B group vitamins: Secondary | ICD-10-CM | POA: Diagnosis not present

## 2020-07-24 DIAGNOSIS — I700 Atherosclerosis of aorta: Secondary | ICD-10-CM | POA: Diagnosis not present

## 2020-07-24 DIAGNOSIS — Z0000 Encounter for general adult medical examination without abnormal findings: Secondary | ICD-10-CM | POA: Diagnosis not present

## 2020-07-24 DIAGNOSIS — Z23 Encounter for immunization: Secondary | ICD-10-CM | POA: Diagnosis not present

## 2020-07-24 DIAGNOSIS — E782 Mixed hyperlipidemia: Secondary | ICD-10-CM | POA: Diagnosis not present

## 2020-10-02 DIAGNOSIS — L821 Other seborrheic keratosis: Secondary | ICD-10-CM | POA: Diagnosis not present

## 2020-10-02 DIAGNOSIS — D2271 Melanocytic nevi of right lower limb, including hip: Secondary | ICD-10-CM | POA: Diagnosis not present

## 2020-10-02 DIAGNOSIS — D2262 Melanocytic nevi of left upper limb, including shoulder: Secondary | ICD-10-CM | POA: Diagnosis not present

## 2020-10-02 DIAGNOSIS — L570 Actinic keratosis: Secondary | ICD-10-CM | POA: Diagnosis not present

## 2020-10-02 DIAGNOSIS — D225 Melanocytic nevi of trunk: Secondary | ICD-10-CM | POA: Diagnosis not present

## 2020-10-02 DIAGNOSIS — Z85828 Personal history of other malignant neoplasm of skin: Secondary | ICD-10-CM | POA: Diagnosis not present

## 2020-10-02 DIAGNOSIS — D2261 Melanocytic nevi of right upper limb, including shoulder: Secondary | ICD-10-CM | POA: Diagnosis not present

## 2020-10-02 DIAGNOSIS — D485 Neoplasm of uncertain behavior of skin: Secondary | ICD-10-CM | POA: Diagnosis not present

## 2020-10-02 DIAGNOSIS — X32XXXA Exposure to sunlight, initial encounter: Secondary | ICD-10-CM | POA: Diagnosis not present

## 2021-01-15 DIAGNOSIS — E538 Deficiency of other specified B group vitamins: Secondary | ICD-10-CM | POA: Diagnosis not present

## 2021-01-15 DIAGNOSIS — E782 Mixed hyperlipidemia: Secondary | ICD-10-CM | POA: Diagnosis not present

## 2021-01-22 DIAGNOSIS — Z0000 Encounter for general adult medical examination without abnormal findings: Secondary | ICD-10-CM | POA: Diagnosis not present

## 2021-01-22 DIAGNOSIS — I700 Atherosclerosis of aorta: Secondary | ICD-10-CM | POA: Diagnosis not present

## 2021-01-22 DIAGNOSIS — E782 Mixed hyperlipidemia: Secondary | ICD-10-CM | POA: Diagnosis not present

## 2021-01-22 DIAGNOSIS — I739 Peripheral vascular disease, unspecified: Secondary | ICD-10-CM | POA: Diagnosis not present

## 2021-01-22 DIAGNOSIS — E538 Deficiency of other specified B group vitamins: Secondary | ICD-10-CM | POA: Diagnosis not present

## 2021-02-14 ENCOUNTER — Other Ambulatory Visit (INDEPENDENT_AMBULATORY_CARE_PROVIDER_SITE_OTHER): Payer: Self-pay | Admitting: Vascular Surgery

## 2021-02-14 DIAGNOSIS — I739 Peripheral vascular disease, unspecified: Secondary | ICD-10-CM

## 2021-02-16 ENCOUNTER — Other Ambulatory Visit: Payer: Self-pay

## 2021-02-16 ENCOUNTER — Ambulatory Visit (INDEPENDENT_AMBULATORY_CARE_PROVIDER_SITE_OTHER): Payer: PPO | Admitting: Vascular Surgery

## 2021-02-16 ENCOUNTER — Ambulatory Visit (INDEPENDENT_AMBULATORY_CARE_PROVIDER_SITE_OTHER): Payer: PPO

## 2021-02-16 ENCOUNTER — Encounter (INDEPENDENT_AMBULATORY_CARE_PROVIDER_SITE_OTHER): Payer: Self-pay | Admitting: Vascular Surgery

## 2021-02-16 VITALS — BP 203/88 | HR 60 | Resp 16 | Ht 64.0 in | Wt 140.8 lb

## 2021-02-16 DIAGNOSIS — I10 Essential (primary) hypertension: Secondary | ICD-10-CM | POA: Diagnosis not present

## 2021-02-16 DIAGNOSIS — E782 Mixed hyperlipidemia: Secondary | ICD-10-CM | POA: Diagnosis not present

## 2021-02-16 DIAGNOSIS — I739 Peripheral vascular disease, unspecified: Secondary | ICD-10-CM

## 2021-02-16 NOTE — Assessment & Plan Note (Signed)
lipid control important in reducing the progression of atherosclerotic disease. Continue statin therapy  

## 2021-02-16 NOTE — Assessment & Plan Note (Signed)
She had a CT scan done towards the end of 2019 which I have independently reviewed.  This was done for diverticular disease, she has fairly extensive aortoiliac calcification as well as femoral bifurcation calcification consistent with disease bilaterally.  Her ABIs today at rest were 0.90 on the right and 0.88 on the left with monophasic to biphasic waveforms. Patient has fairly significant claudication symptoms.  There certainly could be a neurogenic component to hers as well, but given her findings on noninvasive studies and CT scan there is clearly significant peripheral arterial disease.  I discussed with her the pathophysiology and natural history of peripheral arterial disease.  I discussed that she does not have any immediate limb threatening symptoms, but does have significant disease and would expect improvement in her walking distances and activity with intervention.  I discussed the risks and benefits of intervention.  The patient is leery of having anything done at this point does not want to proceed.  I would recommend adding antiplatelet therapy with aspirin at a minimum.  She may benefit from Plavix as well.  She was recently restarted on her Crestor.  Were going to make a short interval follow-up of about 3 months with noninvasive studies for follow-up.  If her symptoms worsen or she decides to proceed with intervention in the interim, she will contact our office.

## 2021-02-16 NOTE — Patient Instructions (Signed)
Peripheral Vascular Disease  Peripheral vascular disease (PVD) is a disease of the blood vessels that carry blood from the heart to the rest of the body. PVD is also called peripheral artery disease (PAD) or poor circulation. PVD affects most of the body. But it affects the legs and feet the most. PVD can lead to acute limb ischemia. This happens when there is a sudden stop of blood flow to an arm or leg. This is a medical emergency. What are the causes? The most common cause of PVD is a buildup of a fatty substance (plaque) inside your arteries. This decreases blood flow. Plaque can break off and block blood in a smaller artery. This can lead to acute limb ischemia. Other common causes of PVD include:  Blood clots inside the blood vessels.  Injuries to blood vessels.  Irritation and swelling of blood vessels.  Sudden tightening of the blood vessel (spasms). What increases the risk?  A family history of PVD.  Medical conditions, including: ? High cholesterol. ? Diabetes. ? High blood pressure. ? Heart disease. ? Past problems with blood clots. ? Past injury, such as burns or a broken bone.  Other conditions, such as: ? Buerger's disease. This is caused by swollen or irritated blood vessels in your hands and feet. ? Arthritis. ? Birth defects that affect the arteries in your legs. ? Kidney disease.  Using tobacco or nicotine products.  Not getting enough exercise.  Being very overweight (obese).  Being 50 years old or older. What are the signs or symptoms?  Cramps in your butt, legs, and feet.  Pain and weakness in your legs when you are active that goes away when you rest.  Leg pain when at rest.  Leg numbness, tingling, or weakness.  Coldness in a leg or foot, especially when compared with the other leg or foot.  Skin or hair changes. These can include: ? Hair loss. ? Shiny skin. ? Pale or bluish skin. ? Thick toenails.  Being unable to get or keep an  erection.  Tiredness (fatigue).  Weak pulse or no pulse in the feet.  Wounds and sores on the toes, feet, or legs. These take longer to heal. How is this treated? Underlying causes are treated first. Other conditions, like diabetes, high cholesterol, and blood pressure, are also treated. Treatment may include:  Lifestyle changes, such as: ? Quitting smoking. ? Getting regular exercise. ? Having a diet low in fat and cholesterol. ? Not drinking alcohol.  Taking medicines, such as: ? Blood thinners. ? Medicines to improve blood flow. ? Medicines to improve your blood cholesterol.  Procedures to: ? Open the arteries and restore blood flow. ? Insert a small mesh tube (stent) to keep a blocked vessel open. ? Create a new path for blood to flow to the body (peripheral bypass). ? Remove dead tissue from a wound. ? Remove an affected leg or arm. Follow these instructions at home: Medicines  Take over-the-counter and prescription medicines only as told by your doctor.  If you are taking blood thinners: ? Talk with your doctor before you take any medicines that have aspirin, or NSAIDs, such as ibuprofen. ? Take medicines exactly as told. Take them at the same time each day. ? Avoid doing things that could hurt or bruise you. Take action to prevent falls. ? Wear an alert bracelet or carry a card that shows you are taking blood thinners. Lifestyle  Get regular exercise. Ask your doctor about how to stay active.    Talk with your doctor about keeping a healthy weight. If needed, ask about losing weight.  Eat a diet that is low in fat and cholesterol. If you need help, talk with your doctor.  Do not drink alcohol.  Do not smoke or use any products that contain nicotine or tobacco. If you need help quitting, ask your doctor.      General instructions  Take good care of your feet. To do this: ? Wear shoes that fit well and feel good. ? Check your feet often for any cuts or  sores.  Get a flu shot (influenza vaccine) each year.  Keep all follow-up visits. Where to find more information  Society for Vascular Surgery: vascular.org  American Heart Association: heart.org  National Heart, Lung, and Blood Institute: nhlbi.nih.gov Contact a doctor if:  You have cramps in your legs when you walk.  You have leg pain when you rest.  Your leg or foot feels cold.  Your skin changes.  You cannot get or keep an erection.  You have cuts or sores on your legs or feet that do not heal. Get help right away if:  You have sudden changes in the color and feeling of your arms or legs, such as: ? Your arm or leg turns cold, numb, and blue. ? Your arm or leg becomes red, warm, swollen, painful, or numb.  You have any signs of a stroke. "BE FAST" is an easy way to remember the main warning signs: ? B - Balance. Dizziness, sudden trouble walking, or loss of balance. ? E - Eyes. Trouble seeing or a change in how you see. ? F - Face. Sudden weakness or loss of feeling of the face. The face or eyelid may droop on one side. ? A - Arms. Weakness or loss of feeling in an arm. This happens all of a sudden and most often on one side of the body. ? S - Speech. Sudden trouble speaking, slurred speech, or trouble understanding what people say. ? T - Time. Time to call emergency services. Write down what time symptoms started.  You have other signs of a stroke, such as: ? A sudden, very bad headache with no known cause. ? Feeling like you may vomit (nausea). ? Vomiting. ? A seizure.  You have chest pain or trouble breathing. These symptoms may be an emergency. Get help right away. Call your local emergency services (911 in the [place]  Do not wait to see if the symptoms will go away.  Do not drive yourself to the hospital. Summary  Peripheral vascular disease (PVD) is a disease of the blood vessels.  PVD affects the legs and feet the most.  Symptoms may include leg  pain or leg numbness, tingling, and weakness.  Treatment may include lifestyle changes, medicines, and procedures. This information is not intended to replace advice given to you by your health care provider. Make sure you discuss any questions you have with your health care provider. Document Revised: 05/01/2020 Document Reviewed: 05/01/2020 Elsevier Patient Education  2021 Elsevier Inc.  

## 2021-02-16 NOTE — Progress Notes (Signed)
Patient ID: Karina Stone, female   DOB: 04/03/1941, 80 y.o.   MRN: [id number]  Chief Complaint  Patient presents with  . New Patient (Initial Visit)    Ref Sabra Heck claudication    HPI Karina Stone is a 80 y.o. female.  I am asked to see the patient by Dr. Sabra Heck for evaluation of claudication symptoms of both lower extremities.  Patient has had worsening pain in her legs and weakness in her legs with activity over the past several years.  There is no clear cause or inciting event that started the symptoms.  Both legs are affected about the same.  She denies any open wounds or infection.  No fevers or chills.  She can only walk about 1 mile in [employer] and this is basically made her stop going to [place].  She lives by herself and her activity is definitely become more limited in the last couple of years.  She had a CT scan done towards the end of 2019 which I have independently reviewed.  This was done for diverticular disease, she has fairly extensive aortoiliac calcification as well as femoral bifurcation calcification consistent with disease bilaterally.  Her ABIs today at rest were 0.90 on the right and 0.88 on the left with monophasic to biphasic waveforms.   Past Medical History:  Diagnosis Date  . Arthritis    finger  . Chronic diarrhea   . Colon polyps   . COPD (chronic obstructive pulmonary disease) ([place])   . Diverticulosis   . Dyspnea   . GERD (gastroesophageal reflux disease)   . Headache   . Hypertension   . Thoracic scoliosis   . Vertigo   . Wears dentures    full upper and lower    Past Surgical History:  Procedure Laterality Date  . ABDOMINAL HYSTERECTOMY    . BROW LIFT Bilateral 02/11/2017   Procedure: bilateral secondary closure surgical wound dehisence extensive;  Surgeon: Karle Starch, MD;  Location: [facility];  Service: Ophthalmology;  Laterality: Bilateral;  . CATARACT EXTRACTION W/PHACO Right 05/04/2019   Procedure: CATARACT EXTRACTION PHACO AND  INTRAOCULAR LENS PLACEMENT ([place])  RIGHT;  Surgeon: Birder Robson, MD;  Location: [facility];  Service: Ophthalmology;  Laterality: Right;  . CATARACT EXTRACTION W/PHACO Left 05/25/2019   Procedure: CATARACT EXTRACTION PHACO AND INTRAOCULAR LENS PLACEMENT ([place]) LEFT;  Surgeon: Birder Robson, MD;  Location: [facility];  Service: Ophthalmology;  Laterality: Left;  . HERNIA REPAIR       Family History  Problem Relation Age of Onset  . Breast cancer Neg Hx   No bleeding disorders, clotting disorders, autoimmune diseases, or aneurysms   Social History   Tobacco Use  . Smoking status: Former Smoker    Quit date: 11/12/1991    Years since quitting: 29.2  . Smokeless tobacco: Never Used  Vaping Use  . Vaping Use: Never used  Substance Use Topics  . Alcohol use: No  . Drug use: No    Allergies  Allergen Reactions  . Codeine     headache Other reaction(s): Headache  . Amitriptyline Palpitations    Current Outpatient Medications  Medication Sig Dispense Refill  . atenolol-chlorthalidone (TENORETIC) 50-25 MG tablet Take 1 tablet by mouth daily.    . meclizine (ANTIVERT) 25 MG tablet Take 25 mg by mouth 3 (three) times daily as needed for dizziness.    . rosuvastatin (CRESTOR) 5 MG tablet Take by mouth.    . traMADol (ULTRAM) 50  MG tablet Take 50 mg by mouth 2 (two) times daily as needed for moderate pain.     Marland Kitchen acetaminophen (TYLENOL) 325 MG tablet Take 650 mg by mouth every 6 (six) hours as needed for mild pain or moderate pain.  (Patient not taking: Reported on 02/16/2021)    . cephALEXin (KEFLEX) 250 MG capsule Take 1 capsule (250 mg total) by mouth 3 (three) times daily. (Patient not taking: Reported on 02/16/2021) 21 capsule 0  . cholecalciferol (VITAMIN D) 1000 units tablet Take 2,000 Units by mouth daily. (Patient not taking: Reported on 02/16/2021)    . omeprazole (PRILOSEC) 20 MG capsule Take 20 mg by mouth daily. (Patient not taking: Reported on 02/16/2021)     . vitamin B-12 (CYANOCOBALAMIN) 1000 MCG tablet Take 1,000 mcg by mouth daily. (Patient not taking: Reported on 02/16/2021)     No current facility-administered medications for this visit.      REVIEW OF SYSTEMS (Negative unless checked)  Constitutional: [] Weight loss  [] Fever  [] Chills Cardiac: [] Chest pain   [] Chest pressure   [] Palpitations   [] Shortness of breath when laying flat   [] Shortness of breath at rest   [] Shortness of breath with exertion. Vascular:  [x] Pain in legs with walking   [] Pain in legs at rest   [x] Pain in legs when laying flat   [x] Claudication   [] Pain in feet when walking  [] Pain in feet at rest  [] Pain in feet when laying flat   [] History of DVT   [] Phlebitis   [] Swelling in legs   [] Varicose veins   [] Non-healing ulcers Pulmonary:   [] Uses home oxygen   [] Productive cough   [] Hemoptysis   [] Wheeze  [] COPD   [] Asthma Neurologic:  [] Dizziness  [] Blackouts   [] Seizures   [] History of stroke   [] History of TIA  [] Aphasia   [] Temporary blindness   [] Dysphagia   [] Weakness or numbness in arms   [] Weakness or numbness in legs Musculoskeletal:  [x] Arthritis   [] Joint swelling   [x] Joint pain   [] Low back pain Hematologic:  [] Easy bruising  [] Easy bleeding   [] Hypercoagulable state   [] Anemic  [] Hepatitis Gastrointestinal:  [] Blood in stool   [] Vomiting blood  [] Gastroesophageal reflux/heartburn   [] Abdominal pain Genitourinary:  [] Chronic kidney disease   [] Difficult urination  [] Frequent urination  [] Burning with urination   [] Hematuria Skin:  [] Rashes   [] Ulcers   [] Wounds Psychological:  [] History of anxiety   []  History of major depression.    Physical Exam BP (!) 203/88 (BP Location: Right Arm)   Pulse 60   Resp 16   Ht 5\' 4"  (1.626 m)   Wt 140 lb 12.8 oz (63.9 kg)   BMI 24.17 kg/m  Gen:  WD/WN, NAD. Appears younger than stated age. Head: [place]/AT, No temporalis wasting. Ear/Nose/Throat: Hearing grossly intact, nares w/o erythema or drainage, oropharynx w/o  Erythema/Exudate Eyes: Conjunctiva clear, sclera non-icteric  Neck: trachea midline.  No JVD.  Pulmonary:  Good air movement, respirations not labored, no use of accessory muscles  Cardiac: RRR, no JVD Vascular:  Vessel Right Left  Radial Palpable Palpable                          PT 1+ 1+  DP 2+ 1+   Gastrointestinal:. No masses, surgical incisions, or scars. Musculoskeletal: M/S 5/5 throughout.  Extremities without ischemic changes.  No deformity or atrophy. no edema. Neurologic: Sensation grossly intact in extremities.  Symmetrical.  Speech is fluent.  Motor exam as listed above. Psychiatric: Judgment intact, Mood & affect appropriate for pt's clinical situation. Dermatologic: No rashes or ulcers noted.  No cellulitis or open wounds.    Radiology No results found.  Labs No results found for this or any previous visit (from the past 2160 hour(s)).  Assessment/Plan:  Hyperlipidemia, mixed lipid control important in reducing the progression of atherosclerotic disease. Continue statin therapy   Benign essential hypertension blood pressure control important in reducing the progression of atherosclerotic disease. On appropriate oral medications.   PAD (peripheral artery disease) ([place]) She had a CT scan done towards the end of 2019 which I have independently reviewed.  This was done for diverticular disease, she has fairly extensive aortoiliac calcification as well as femoral bifurcation calcification consistent with disease bilaterally.  Her ABIs today at rest were 0.90 on the right and 0.88 on the left with monophasic to biphasic waveforms. Patient has fairly significant claudication symptoms.  There certainly could be a neurogenic component to hers as well, but given her findings on noninvasive studies and CT scan there is clearly significant peripheral arterial disease.  I discussed with her the pathophysiology and natural history of peripheral arterial disease.  I discussed  that she does not have any immediate limb threatening symptoms, but does have significant disease and would expect improvement in her walking distances and activity with intervention.  I discussed the risks and benefits of intervention.  The patient is leery of having anything done at this point does not want to proceed.  I would recommend adding antiplatelet therapy with aspirin at a minimum.  She may benefit from Plavix as well.  She was recently restarted on her Crestor.  Were going to make a short interval follow-up of about 3 months with noninvasive studies for follow-up.  If her symptoms worsen or she decides to proceed with intervention in the interim, she will contact our office.      Leotis Pain 02/16/2021, 11:58 AM   This note was created with Dragon medical transcription system.  Any errors from dictation are unintentional.

## 2021-02-16 NOTE — Assessment & Plan Note (Signed)
blood pressure control important in reducing the progression of atherosclerotic disease. On appropriate oral medications.  

## 2021-03-23 DIAGNOSIS — H353131 Nonexudative age-related macular degeneration, bilateral, early dry stage: Secondary | ICD-10-CM | POA: Diagnosis not present

## 2021-03-23 DIAGNOSIS — Z961 Presence of intraocular lens: Secondary | ICD-10-CM | POA: Diagnosis not present

## 2021-05-18 ENCOUNTER — Encounter (INDEPENDENT_AMBULATORY_CARE_PROVIDER_SITE_OTHER): Payer: Self-pay | Admitting: Vascular Surgery

## 2021-05-18 ENCOUNTER — Ambulatory Visit (INDEPENDENT_AMBULATORY_CARE_PROVIDER_SITE_OTHER): Payer: PPO | Admitting: Vascular Surgery

## 2021-05-18 ENCOUNTER — Ambulatory Visit (INDEPENDENT_AMBULATORY_CARE_PROVIDER_SITE_OTHER): Payer: PPO

## 2021-05-18 ENCOUNTER — Other Ambulatory Visit: Payer: Self-pay

## 2021-05-18 VITALS — BP 166/75 | HR 53 | Resp 14 | Ht 64.0 in | Wt 138.0 lb

## 2021-05-18 DIAGNOSIS — I739 Peripheral vascular disease, unspecified: Secondary | ICD-10-CM

## 2021-05-18 DIAGNOSIS — E782 Mixed hyperlipidemia: Secondary | ICD-10-CM

## 2021-05-18 DIAGNOSIS — I10 Essential (primary) hypertension: Secondary | ICD-10-CM

## 2021-05-18 NOTE — Progress Notes (Signed)
MRN : [id number]  Karina Stone is a 80 y.o. (1941/03/31) female who presents with chief complaint of  Chief Complaint  Patient presents with   Follow-up    Ultrasound  .  History of Present Illness: Patient returns today in follow up of PAD.  She says things have not really gotten any worse but they are not any better.  She still has some difficulty with ambulation and pain with activity.  No rest pain.  No ulceration.  Her ABIs today are stable at 0.89 on the right and 0.84 on the left with waveforms that are monophasic but fairly strong.  Duplex does not show any obvious stenosis in the infrainguinal segments on either side likely indicating some degree of aortoiliac disease.  This would be expected from her previous CT.  Current Outpatient Medications  Medication Sig Dispense Refill   atenolol-chlorthalidone (TENORETIC) 50-25 MG tablet Take 1 tablet by mouth daily.     meclizine (ANTIVERT) 25 MG tablet Take 25 mg by mouth 3 (three) times daily as needed for dizziness.     rosuvastatin (CRESTOR) 5 MG tablet Take by mouth.     traMADol (ULTRAM) 50 MG tablet Take 50 mg by mouth 2 (two) times daily as needed for moderate pain.      acetaminophen (TYLENOL) 325 MG tablet Take 650 mg by mouth every 6 (six) hours as needed for mild pain or moderate pain.  (Patient not taking: Reported on 02/16/2021)     cephALEXin (KEFLEX) 250 MG capsule Take 1 capsule (250 mg total) by mouth 3 (three) times daily. (Patient not taking: Reported on 02/16/2021) 21 capsule 0   cholecalciferol (VITAMIN D) 1000 units tablet Take 2,000 Units by mouth daily. (Patient not taking: Reported on 02/16/2021)     omeprazole (PRILOSEC) 20 MG capsule Take 20 mg by mouth daily. (Patient not taking: Reported on 02/16/2021)     vitamin B-12 (CYANOCOBALAMIN) 1000 MCG tablet Take 1,000 mcg by mouth daily. (Patient not taking: Reported on 02/16/2021)     No current facility-administered medications for this visit.    Past Medical History:   Diagnosis Date   Arthritis    finger   Chronic diarrhea    Colon polyps    COPD (chronic obstructive pulmonary disease) ([facility])    Diverticulosis    Dyspnea    GERD (gastroesophageal reflux disease)    Headache    Hypertension    Thoracic scoliosis    Vertigo    Wears dentures    full upper and lower    Past Surgical History:  Procedure Laterality Date   ABDOMINAL HYSTERECTOMY     BROW LIFT Bilateral 02/11/2017   Procedure: bilateral secondary closure surgical wound dehisence extensive;  Surgeon: Karle Starch, MD;  Location: [facility];  Service: Ophthalmology;  Laterality: Bilateral;   CATARACT EXTRACTION W/PHACO Right 05/04/2019   Procedure: CATARACT EXTRACTION PHACO AND INTRAOCULAR LENS PLACEMENT ([place])  RIGHT;  Surgeon: Birder Robson, MD;  Location: [facility];  Service: Ophthalmology;  Laterality: Right;   CATARACT EXTRACTION W/PHACO Left 05/25/2019   Procedure: CATARACT EXTRACTION PHACO AND INTRAOCULAR LENS PLACEMENT ([place]) LEFT;  Surgeon: Birder Robson, MD;  Location: [facility];  Service: Ophthalmology;  Laterality: Left;   HERNIA REPAIR       Social History   Tobacco Use   Smoking status: Former    Pack years: 0.00    Types: Cigarettes    Quit date: 11/12/1991    Years since quitting:  29.5   Smokeless tobacco: Never  Vaping Use   Vaping Use: Never used  Substance Use Topics   Alcohol use: No   Drug use: No       Family History  Problem Relation Age of Onset   Breast cancer Neg Hx      Allergies  Allergen Reactions   Codeine     headache Other reaction(s): Headache   Amitriptyline Palpitations       REVIEW OF SYSTEMS (Negative unless checked)   Constitutional: [] Weight loss  [] Fever  [] Chills Cardiac: [] Chest pain   [] Chest pressure   [] Palpitations   [] Shortness of breath when laying flat   [] Shortness of breath at rest   [] Shortness of breath with exertion. Vascular:  [x] Pain in legs with walking   [] Pain in legs  at rest   [x] Pain in legs when laying flat   [x] Claudication   [] Pain in feet when walking  [] Pain in feet at rest  [] Pain in feet when laying flat   [] History of DVT   [] Phlebitis   [] Swelling in legs   [] Varicose veins   [] Non-healing ulcers Pulmonary:   [] Uses home oxygen   [] Productive cough   [] Hemoptysis   [] Wheeze  [] COPD   [] Asthma Neurologic:  [] Dizziness  [] Blackouts   [] Seizures   [] History of stroke   [] History of TIA  [] Aphasia   [] Temporary blindness   [] Dysphagia   [] Weakness or numbness in arms   [] Weakness or numbness in legs Musculoskeletal:  [x] Arthritis   [] Joint swelling   [x] Joint pain   [] Low back pain Hematologic:  [] Easy bruising  [] Easy bleeding   [] Hypercoagulable state   [] Anemic  [] Hepatitis Gastrointestinal:  [] Blood in stool   [] Vomiting blood  [] Gastroesophageal reflux/heartburn   [] Abdominal pain Genitourinary:  [] Chronic kidney disease   [] Difficult urination  [] Frequent urination  [] Burning with urination   [] Hematuria Skin:  [] Rashes   [] Ulcers   [] Wounds Psychological:  [] History of anxiety   []  History of major depression.  Physical Examination  BP (!) 166/75 (BP Location: Right Arm)   Pulse (!) 53   Resp 14   Ht 5\' 4"  (1.626 m)   Wt 138 lb (62.6 kg)   BMI 23.69 kg/m  Gen:  WD/WN, NAD Head: [place]/AT, No temporalis wasting. Ear/Nose/Throat: Hearing grossly intact, nares w/o erythema or drainage Eyes: Conjunctiva clear. Sclera non-icteric Neck: Supple.  Trachea midline Pulmonary:  Good air movement, no use of accessory muscles.  Cardiac: RRR, no JVD Vascular:  Vessel Right Left  Radial Palpable Palpable                          PT 1+ Palpable 1+ Palpable  DP 1+ Palpable 1+ Palpable   Gastrointestinal: soft, non-tender/non-distended. No guarding/reflex.  Musculoskeletal: M/S 5/5 throughout.  No deformity or atrophy. No edema. Neurologic: Sensation grossly intact in extremities.  Symmetrical.  Speech is fluent.  Psychiatric: Judgment intact,  Mood & affect appropriate for pt's clinical situation. Dermatologic: No rashes or ulcers noted.  No cellulitis or open wounds.      Labs No results found for this or any previous visit (from the past 2160 hour(s)).  Radiology No results found.  Assessment/Plan Hyperlipidemia, mixed lipid control important in reducing the progression of atherosclerotic disease. Continue statin therapy     Benign essential hypertension blood pressure control important in reducing the progression of atherosclerotic disease. On appropriate oral medications.  PAD (peripheral artery disease) ([facility]) Her ABIs today  are stable at 0.89 on the right and 0.84 on the left with waveforms that are monophasic but fairly strong.  Duplex does not show any obvious stenosis in the infrainguinal segments on either side likely indicating some degree of aortoiliac disease.  This was noted from her previous CT scan in 2019.  We again discussed options.  She does not have any limb threatening symptoms.  She does not want to have any intervention at this time which is reasonable.  If her symptoms worsen she will contact our office.  Otherwise, I will see her in 6 months.    Leotis Pain, MD  05/18/2021 11:31 AM    This note was created with Dragon medical transcription system.  Any errors from dictation are purely unintentional

## 2021-05-18 NOTE — Assessment & Plan Note (Signed)
Her ABIs today are stable at 0.89 on the right and 0.84 on the left with waveforms that are monophasic but fairly strong.  Duplex does not show any obvious stenosis in the infrainguinal segments on either side likely indicating some degree of aortoiliac disease.  This was noted from her previous CT scan in 2019.  We again discussed options.  She does not have any limb threatening symptoms.  She does not want to have any intervention at this time which is reasonable.  If her symptoms worsen she will contact our office.  Otherwise, I will see her in 6 months.

## 2021-05-28 DIAGNOSIS — R5382 Chronic fatigue, unspecified: Secondary | ICD-10-CM | POA: Diagnosis not present

## 2021-05-28 DIAGNOSIS — R0600 Dyspnea, unspecified: Secondary | ICD-10-CM | POA: Diagnosis not present

## 2021-05-28 DIAGNOSIS — U099 Post covid-19 condition, unspecified: Secondary | ICD-10-CM | POA: Diagnosis not present

## 2021-07-24 DIAGNOSIS — E538 Deficiency of other specified B group vitamins: Secondary | ICD-10-CM | POA: Diagnosis not present

## 2021-07-24 DIAGNOSIS — E782 Mixed hyperlipidemia: Secondary | ICD-10-CM | POA: Diagnosis not present

## 2021-07-31 DIAGNOSIS — I739 Peripheral vascular disease, unspecified: Secondary | ICD-10-CM | POA: Diagnosis not present

## 2021-07-31 DIAGNOSIS — Z0000 Encounter for general adult medical examination without abnormal findings: Secondary | ICD-10-CM | POA: Diagnosis not present

## 2021-07-31 DIAGNOSIS — Z23 Encounter for immunization: Secondary | ICD-10-CM | POA: Diagnosis not present

## 2021-07-31 DIAGNOSIS — E538 Deficiency of other specified B group vitamins: Secondary | ICD-10-CM | POA: Diagnosis not present

## 2021-07-31 DIAGNOSIS — E782 Mixed hyperlipidemia: Secondary | ICD-10-CM | POA: Diagnosis not present

## 2021-10-02 DIAGNOSIS — Z85828 Personal history of other malignant neoplasm of skin: Secondary | ICD-10-CM | POA: Diagnosis not present

## 2021-10-02 DIAGNOSIS — X32XXXA Exposure to sunlight, initial encounter: Secondary | ICD-10-CM | POA: Diagnosis not present

## 2021-10-02 DIAGNOSIS — D2262 Melanocytic nevi of left upper limb, including shoulder: Secondary | ICD-10-CM | POA: Diagnosis not present

## 2021-10-02 DIAGNOSIS — D2261 Melanocytic nevi of right upper limb, including shoulder: Secondary | ICD-10-CM | POA: Diagnosis not present

## 2021-10-02 DIAGNOSIS — L538 Other specified erythematous conditions: Secondary | ICD-10-CM | POA: Diagnosis not present

## 2021-10-02 DIAGNOSIS — L570 Actinic keratosis: Secondary | ICD-10-CM | POA: Diagnosis not present

## 2021-10-02 DIAGNOSIS — D2271 Melanocytic nevi of right lower limb, including hip: Secondary | ICD-10-CM | POA: Diagnosis not present

## 2021-10-02 DIAGNOSIS — L820 Inflamed seborrheic keratosis: Secondary | ICD-10-CM | POA: Diagnosis not present

## 2021-11-23 ENCOUNTER — Ambulatory Visit (INDEPENDENT_AMBULATORY_CARE_PROVIDER_SITE_OTHER): Payer: PPO

## 2021-11-23 ENCOUNTER — Other Ambulatory Visit: Payer: Self-pay

## 2021-11-23 ENCOUNTER — Encounter (INDEPENDENT_AMBULATORY_CARE_PROVIDER_SITE_OTHER): Payer: Self-pay | Admitting: Vascular Surgery

## 2021-11-23 ENCOUNTER — Ambulatory Visit (INDEPENDENT_AMBULATORY_CARE_PROVIDER_SITE_OTHER): Payer: PPO | Admitting: Vascular Surgery

## 2021-11-23 VITALS — BP 194/81 | HR 58 | Resp 16 | Wt 139.8 lb

## 2021-11-23 DIAGNOSIS — I10 Essential (primary) hypertension: Secondary | ICD-10-CM

## 2021-11-23 DIAGNOSIS — I70213 Atherosclerosis of native arteries of extremities with intermittent claudication, bilateral legs: Secondary | ICD-10-CM | POA: Diagnosis not present

## 2021-11-23 DIAGNOSIS — I739 Peripheral vascular disease, unspecified: Secondary | ICD-10-CM

## 2021-11-23 DIAGNOSIS — I70219 Atherosclerosis of native arteries of extremities with intermittent claudication, unspecified extremity: Secondary | ICD-10-CM | POA: Insufficient documentation

## 2021-11-23 DIAGNOSIS — E782 Mixed hyperlipidemia: Secondary | ICD-10-CM

## 2021-11-23 NOTE — Assessment & Plan Note (Signed)
Although her ABIs are roughly stable, her symptoms have progressed.  She is interested in having intervention for her peripheral arterial disease.  With anticipation of aortoiliac disease, we may be able to treat both sides concomitantly.  I have discussed the risks and benefits of the procedure.  Patient voices her understanding and is agreeable to proceed

## 2021-11-23 NOTE — Progress Notes (Signed)
MRN : [id number]  Karina Stone is a 81 y.o. (06/24/1941) female who presents with chief complaint of  Chief Complaint  Patient presents with   Follow-up    Ultrasound follow up  .  History of Present Illness: Patient returns today in follow up of her PAD with claudication.  She says her claudication symptoms have progressed since her last visit.  No new rest pain, ulceration, or infection.  Her ABIs are stable at 0.88 on the right and 0.91 on the left.  The legs are roughly symmetric in terms of their symptomatology.  Stairs or inclines markedly increased the pain as does trying to walk at a faster rate.  The pain starts in the thighs and hips and radiates down the leg.  Current Outpatient Medications  Medication Sig Dispense Refill   aspirin 81 MG EC tablet Take by mouth.     atenolol-chlorthalidone (TENORETIC) 50-25 MG tablet Take 1 tablet by mouth daily.     meclizine (ANTIVERT) 25 MG tablet Take 25 mg by mouth 3 (three) times daily as needed for dizziness.     pravastatin (PRAVACHOL) 20 MG tablet Take 20 mg by mouth daily.     traMADol (ULTRAM) 50 MG tablet Take 50 mg by mouth 2 (two) times daily as needed for moderate pain.      acetaminophen (TYLENOL) 325 MG tablet Take 650 mg by mouth every 6 (six) hours as needed for mild pain or moderate pain.  (Patient not taking: Reported on 02/16/2021)     cephALEXin (KEFLEX) 250 MG capsule Take 1 capsule (250 mg total) by mouth 3 (three) times daily. (Patient not taking: Reported on 02/16/2021) 21 capsule 0   cholecalciferol (VITAMIN D) 1000 units tablet Take 2,000 Units by mouth daily. (Patient not taking: Reported on 02/16/2021)     omeprazole (PRILOSEC) 20 MG capsule Take 20 mg by mouth daily. (Patient not taking: Reported on 02/16/2021)     rosuvastatin (CRESTOR) 5 MG tablet Take by mouth.     vitamin B-12 (CYANOCOBALAMIN) 1000 MCG tablet Take 1,000 mcg by mouth daily. (Patient not taking: Reported on 02/16/2021)     No current  facility-administered medications for this visit.    Past Medical History:  Diagnosis Date   Arthritis    finger   Chronic diarrhea    Colon polyps    COPD (chronic obstructive pulmonary disease) (HCC)    Diverticulosis    Dyspnea    GERD (gastroesophageal reflux disease)    Headache    Hypertension    Thoracic scoliosis    Vertigo    Wears dentures    full upper and lower    Past Surgical History:  Procedure Laterality Date   ABDOMINAL HYSTERECTOMY     BROW LIFT Bilateral 02/11/2017   Procedure: bilateral secondary closure surgical wound dehisence extensive;  Surgeon: Karle Starch, MD;  Location: [place];  Service: Ophthalmology;  Laterality: Bilateral;   CATARACT EXTRACTION W/PHACO Right 05/04/2019   Procedure: CATARACT EXTRACTION PHACO AND INTRAOCULAR LENS PLACEMENT ([place])  RIGHT;  Surgeon: Birder Robson, MD;  Location: [place];  Service: Ophthalmology;  Laterality: Right;   CATARACT EXTRACTION W/PHACO Left 05/25/2019   Procedure: CATARACT EXTRACTION PHACO AND INTRAOCULAR LENS PLACEMENT ([place]) LEFT;  Surgeon: Birder Robson, MD;  Location: [place];  Service: Ophthalmology;  Laterality: Left;   HERNIA REPAIR       Social History   Tobacco Use   Smoking status: Former    Types:  Cigarettes    Quit date: 11/12/1991    Years since quitting: 30.0   Smokeless tobacco: Never  Vaping Use   Vaping Use: Never used  Substance Use Topics   Alcohol use: No   Drug use: No      Family History  Problem Relation Age of Onset   Breast cancer Neg Hx      Allergies  Allergen Reactions   Codeine     headache Other reaction(s): Headache   Amitriptyline Palpitations    REVIEW OF SYSTEMS (Negative unless checked)   Constitutional: [] Weight loss  [] Fever  [] Chills Cardiac: [] Chest pain   [] Chest pressure   [] Palpitations   [] Shortness of breath when laying flat   [] Shortness of breath at rest   [] Shortness of breath with  exertion. Vascular:  [x] Pain in legs with walking   [] Pain in legs at rest   [x] Pain in legs when laying flat   [x] Claudication   [] Pain in feet when walking  [] Pain in feet at rest  [] Pain in feet when laying flat   [] History of DVT   [] Phlebitis   [] Swelling in legs   [] Varicose veins   [] Non-healing ulcers Pulmonary:   [] Uses home oxygen   [] Productive cough   [] Hemoptysis   [] Wheeze  [] COPD   [] Asthma Neurologic:  [] Dizziness  [] Blackouts   [] Seizures   [] History of stroke   [] History of TIA  [] Aphasia   [] Temporary blindness   [] Dysphagia   [] Weakness or numbness in arms   [] Weakness or numbness in legs Musculoskeletal:  [x] Arthritis   [] Joint swelling   [x] Joint pain   [] Low back pain Hematologic:  [] Easy bruising  [] Easy bleeding   [] Hypercoagulable state   [] Anemic  [] Hepatitis Gastrointestinal:  [] Blood in stool   [] Vomiting blood  [] Gastroesophageal reflux/heartburn   [] Abdominal pain Genitourinary:  [] Chronic kidney disease   [] Difficult urination  [] Frequent urination  [] Burning with urination   [] Hematuria Skin:  [] Rashes   [] Ulcers   [] Wounds Psychological:  [] History of anxiety   []  History of major depression.  Physical Examination  BP (!) 194/81 (BP Location: Right Arm)    Pulse (!) 58    Resp 16    Wt 139 lb 12.8 oz (63.4 kg)    BMI 24.00 kg/m  Gen:  WD/WN, NAD Head: /AT, No temporalis wasting. Ear/Nose/Throat: Hearing grossly intact, nares w/o erythema or drainage Eyes: Conjunctiva clear. Sclera non-icteric Neck: Supple.  Trachea midline Pulmonary:  Good air movement, no use of accessory muscles.  Cardiac: Somewhat irregular Vascular:  Vessel Right Left  Radial Palpable Palpable                          PT Palpable Palpable  DP Palpable Palpable   Gastrointestinal: soft, non-tender/non-distended. No guarding/reflex.  Musculoskeletal: M/S 5/5 throughout.  No deformity or atrophy.  Trace edema. Neurologic: Sensation grossly intact in extremities.  Symmetrical.   Speech is fluent.  Psychiatric: Judgment intact, Mood & affect appropriate for pt's clinical situation. Dermatologic: No rashes or ulcers noted.  No cellulitis or open wounds.      Labs No results found for this or any previous visit (from the past 2160 hour(s)).  Radiology No results found.  Assessment/Plan Hyperlipidemia, mixed lipid control important in reducing the progression of atherosclerotic disease. Continue statin therapy     Benign essential hypertension blood pressure control important in reducing the progression of atherosclerotic disease. On appropriate oral medications.  Atherosclerosis of native arteries  of extremity with intermittent claudication ([place]) Although her ABIs are roughly stable, her symptoms have progressed.  She is interested in having intervention for her peripheral arterial disease.  With anticipation of aortoiliac disease, we may be able to treat both sides concomitantly.  I have discussed the risks and benefits of the procedure.  Patient voices her understanding and is agreeable to proceed    Leotis Pain, MD  11/23/2021 11:56 AM    This note was created with Dragon medical transcription system.  Any errors from dictation are purely unintentional

## 2022-01-21 DIAGNOSIS — E538 Deficiency of other specified B group vitamins: Secondary | ICD-10-CM | POA: Diagnosis not present

## 2022-01-21 DIAGNOSIS — E782 Mixed hyperlipidemia: Secondary | ICD-10-CM | POA: Diagnosis not present

## 2022-01-28 DIAGNOSIS — E538 Deficiency of other specified B group vitamins: Secondary | ICD-10-CM | POA: Diagnosis not present

## 2022-01-28 DIAGNOSIS — G309 Alzheimer's disease, unspecified: Secondary | ICD-10-CM | POA: Diagnosis not present

## 2022-01-28 DIAGNOSIS — E782 Mixed hyperlipidemia: Secondary | ICD-10-CM | POA: Diagnosis not present

## 2022-01-28 DIAGNOSIS — E559 Vitamin D deficiency, unspecified: Secondary | ICD-10-CM | POA: Diagnosis not present

## 2022-01-28 DIAGNOSIS — Z0000 Encounter for general adult medical examination without abnormal findings: Secondary | ICD-10-CM | POA: Diagnosis not present

## 2022-01-28 DIAGNOSIS — I700 Atherosclerosis of aorta: Secondary | ICD-10-CM | POA: Diagnosis not present

## 2022-01-28 DIAGNOSIS — F0670 Mild neurocognitive disorder due to known physiological condition without behavioral disturbance: Secondary | ICD-10-CM | POA: Diagnosis not present

## 2022-01-28 DIAGNOSIS — I739 Peripheral vascular disease, unspecified: Secondary | ICD-10-CM | POA: Diagnosis not present

## 2022-01-29 ENCOUNTER — Telehealth (INDEPENDENT_AMBULATORY_CARE_PROVIDER_SITE_OTHER): Payer: Self-pay | Admitting: Vascular Surgery

## 2022-01-29 NOTE — Telephone Encounter (Signed)
LVM with Lambert Keto at [facility] regarding a referral for pt. I do not have a referral pt- left fax number for it to be faxed over.  ?

## 2022-02-07 ENCOUNTER — Telehealth (INDEPENDENT_AMBULATORY_CARE_PROVIDER_SITE_OTHER): Payer: Self-pay

## 2022-02-07 NOTE — Telephone Encounter (Signed)
Spoke with the patient and she is scheduled with Dr. Lucky Cowboy for a bilateral iliac stent placement on 02/11/22 with a 12:45 pm arrival time to the MM. Pre-procedure instructions were discussed and will be mailed. ?

## 2022-02-11 ENCOUNTER — Ambulatory Visit
Admission: RE | Admit: 2022-02-11 | Discharge: 2022-02-11 | Disposition: A | Discharge status: Home / Self Care | Payer: PPO | Attending: Vascular Surgery | Admitting: Vascular Surgery

## 2022-02-11 ENCOUNTER — Encounter
Admission: RE | Disposition: A | Discharge status: Home / Self Care | Payer: Self-pay | Source: Home / Self Care | Attending: Vascular Surgery

## 2022-02-11 DIAGNOSIS — I70213 Atherosclerosis of native arteries of extremities with intermittent claudication, bilateral legs: Secondary | ICD-10-CM | POA: Insufficient documentation

## 2022-02-11 DIAGNOSIS — E782 Mixed hyperlipidemia: Secondary | ICD-10-CM | POA: Diagnosis not present

## 2022-02-11 DIAGNOSIS — I10 Essential (primary) hypertension: Secondary | ICD-10-CM | POA: Insufficient documentation

## 2022-02-11 DIAGNOSIS — J449 Chronic obstructive pulmonary disease, unspecified: Secondary | ICD-10-CM | POA: Diagnosis not present

## 2022-02-11 DIAGNOSIS — I700 Atherosclerosis of aorta: Secondary | ICD-10-CM

## 2022-02-11 DIAGNOSIS — Z87891 Personal history of nicotine dependence: Secondary | ICD-10-CM | POA: Diagnosis not present

## 2022-02-11 DIAGNOSIS — I70219 Atherosclerosis of native arteries of extremities with intermittent claudication, unspecified extremity: Secondary | ICD-10-CM

## 2022-02-11 HISTORY — PX: LOWER EXTREMITY ANGIOGRAPHY: CATH118251

## 2022-02-11 LAB — BUN: BUN: 18 mg/dL (ref 8–23)

## 2022-02-11 LAB — CREATININE, SERUM
Creatinine, Ser: 0.94 mg/dL (ref 0.44–1.00)
GFR, Estimated: >60 mL/min (ref >60)

## 2022-02-11 SURGERY — LOWER EXTREMITY ANGIOGRAPHY
Anesthesia: Moderate Sedation | Laterality: Bilateral

## 2022-02-11 MED ORDER — HYDRALAZINE HCL 20 MG/ML IJ SOLN: 5.0000 mg | INTRAMUSCULAR | Status: DC | PRN

## 2022-02-11 MED ORDER — FENTANYL CITRATE PF 50 MCG/ML IJ SOSY
PREFILLED_SYRINGE | INTRAMUSCULAR | Status: AC
  Filled 2022-02-11: qty 1

## 2022-02-11 MED ORDER — SODIUM CHLORIDE 0.9% FLUSH: 3.0000 mL | Freq: Two times a day (BID) | INTRAVENOUS | Status: DC

## 2022-02-11 MED ORDER — CLOPIDOGREL BISULFATE 75 MG PO TABS: 75.0000 mg | ORAL_TABLET | Freq: Every day | ORAL | 11 refills | Status: DC

## 2022-02-11 MED ORDER — DIPHENHYDRAMINE HCL 50 MG/ML IJ SOLN: 50.0000 mg | Freq: Once | INTRAMUSCULAR | Status: DC | PRN

## 2022-02-11 MED ORDER — FENTANYL CITRATE (PF) 100 MCG/2ML IJ SOLN
INTRAMUSCULAR | Status: DC | PRN
  Administered 2022-02-11: 25 ug via INTRAVENOUS
  Administered 2022-02-11: 50 ug via INTRAVENOUS

## 2022-02-11 MED ORDER — METHYLPREDNISOLONE SODIUM SUCC 125 MG IJ SOLR: 125.0000 mg | Freq: Once | INTRAMUSCULAR | Status: DC | PRN

## 2022-02-11 MED ORDER — LABETALOL HCL 5 MG/ML IV SOLN: 10.0000 mg | INTRAVENOUS | Status: DC | PRN

## 2022-02-11 MED ORDER — SODIUM CHLORIDE 0.9% FLUSH: 3.0000 mL | INTRAVENOUS | Status: DC | PRN

## 2022-02-11 MED ORDER — ONDANSETRON HCL 4 MG/2ML IJ SOLN: 4.0000 mg | Freq: Four times a day (QID) | INTRAMUSCULAR | Status: DC | PRN

## 2022-02-11 MED ORDER — FAMOTIDINE 20 MG PO TABS: 40.0000 mg | ORAL_TABLET | Freq: Once | ORAL | Status: DC | PRN

## 2022-02-11 MED ORDER — HEPARIN SODIUM (PORCINE) 1000 UNIT/ML IJ SOLN
INTRAMUSCULAR | Status: DC | PRN
  Administered 2022-02-11: 5000 [IU] via INTRAVENOUS

## 2022-02-11 MED ORDER — HYDROMORPHONE HCL 1 MG/ML IJ SOLN: 1.0000 mg | Freq: Once | INTRAMUSCULAR | Status: DC | PRN

## 2022-02-11 MED ORDER — CLOPIDOGREL BISULFATE 75 MG PO TABS: 75.0000 mg | ORAL_TABLET | Freq: Every day | ORAL | Status: DC

## 2022-02-11 MED ORDER — HEPARIN SODIUM (PORCINE) 1000 UNIT/ML IJ SOLN
INTRAMUSCULAR | Status: AC
  Filled 2022-02-11: qty 10

## 2022-02-11 MED ORDER — CEFAZOLIN SODIUM-DEXTROSE 2-4 GM/100ML-% IV SOLN
2.0000 g | Freq: Once | INTRAVENOUS | Status: AC
  Administered 2022-02-11: 2 g via INTRAVENOUS

## 2022-02-11 MED ORDER — MIDAZOLAM HCL 2 MG/2ML IJ SOLN
INTRAMUSCULAR | Status: AC
  Filled 2022-02-11: qty 2

## 2022-02-11 MED ORDER — MIDAZOLAM HCL 2 MG/2ML IJ SOLN
INTRAMUSCULAR | Status: DC | PRN
  Administered 2022-02-11: 2 mg via INTRAVENOUS
  Administered 2022-02-11: 1 mg via INTRAVENOUS

## 2022-02-11 MED ORDER — SODIUM CHLORIDE 0.9 % IV SOLN: INTRAVENOUS | Status: DC

## 2022-02-11 MED ORDER — ACETAMINOPHEN 325 MG PO TABS: 650.0000 mg | ORAL_TABLET | ORAL | Status: DC | PRN

## 2022-02-11 MED ORDER — DIPHENHYDRAMINE HCL 50 MG/ML IJ SOLN
INTRAMUSCULAR | Status: DC | PRN
  Administered 2022-02-11: 12.5 mg via INTRAVENOUS

## 2022-02-11 MED ORDER — MIDAZOLAM HCL 2 MG/ML PO SYRP: 8.0000 mg | ORAL_SOLUTION | Freq: Once | ORAL | Status: DC | PRN

## 2022-02-11 MED ORDER — DIPHENHYDRAMINE HCL 50 MG/ML IJ SOLN
INTRAMUSCULAR | Status: AC
  Filled 2022-02-11: qty 1

## 2022-02-11 MED ORDER — MIDAZOLAM HCL 2 MG/2ML IJ SOLN
INTRAMUSCULAR | Status: DC
Start: 2022-02-11 — End: 2022-02-11
  Filled 2022-02-11: qty 2

## 2022-02-11 MED ORDER — SODIUM CHLORIDE 0.9 % IV SOLN: 250.0000 mL | INTRAVENOUS | Status: DC | PRN

## 2022-02-11 SURGICAL SUPPLY — 17 items
BALLN ULTRVRSE PTA 5X40X75C (BALLOONS) ×2
BALLOON ULTRVRSE PTA 5X40X75C (BALLOONS) IMPLANT
CATH ANGIO 5F PIGTAIL 65CM (CATHETERS) ×1 IMPLANT
CATH BEACON 5 .035 40 KMP TP (CATHETERS) IMPLANT
CATH BEACON 5 .038 40 KMP TP (CATHETERS) ×1
COVER PROBE U/S 5X48 (MISCELLANEOUS) ×1 IMPLANT
DEVICE STARCLOSE SE CLOSURE (Vascular Products) ×2 IMPLANT
GLIDEWIRE ADV .035X180CM (WIRE) ×2 IMPLANT
INTRODUCER 7FR 23CM (INTRODUCER) ×2 IMPLANT
KIT ENCORE 26 ADVANTAGE (KITS) ×2 IMPLANT
PACK ANGIOGRAPHY (CUSTOM PROCEDURE TRAY) ×2 IMPLANT
SHEATH BRITE TIP 5FRX11 (SHEATH) ×1 IMPLANT
STENT LIFESTREAM 7X58X80 (Permanent Stent) ×1 IMPLANT
STENT LIFESTREAM 8X58X80 (Permanent Stent) ×1 IMPLANT
SYR MEDRAD MARK 7 150ML (SYRINGE) ×1 IMPLANT
TUBING CONTRAST HIGH PRESS 72 (TUBING) ×1 IMPLANT
WIRE GUIDERIGHT .035X150 (WIRE) ×1 IMPLANT

## 2022-02-11 NOTE — Op Note (Signed)
[facility] ? Percutaneous Study/Intervention Procedural Note ? ? ?Date of Surgery: 02/11/2022 ? ?Surgeon(s):Teka Chanda   ? ?Assistants:none ? ?Pre-operative Diagnosis: PAD with claudication bilateral lower extremities ? ?Post-operative diagnosis:  Same ? ?Procedure(s) Performed: ?            1.  Ultrasound guidance for vascular access bilateral femoral arteries ?            2.  Catheter placement into aorta from bilateral femoral approaches ?            3.  Aortogram and bilateral iliofemoral angiograms ?            4.  Percutaneous transluminal angioplasty of right common iliac artery with a 5 mm balloon to predilate the lesion ?            5.  Kissing balloon expandable stent placements to bilateral common iliac arteries in the distal aorta using an 8 mm diameter by 58 mm length lifestream stent on the right and a 7 mm diameter by 58 mm length lifestream stent on the left ? 6.  StarClose closure device bilateral femoral arteries ? ?EBL: 10 cc ? ?Contrast: 35 cc ? ?Fluoro Time: 4.2 minutes ? ?Moderate Conscious Sedation Time: approximately 48 minutes using 3 mg of Versed and 75 mcg of Fentanyl ?             ?Indications:  Patient is a 81 y.o.female with worsening bilateral lower extremity claudication symptoms. The patient has noninvasive study showing some reduction in the ABI with diminished waveforms proximally suggesting aortoiliac disease. The patient is brought in for angiography for further evaluation and potential treatment.  Risks and benefits are discussed and informed consent is obtained.  ? ?Procedure:  The patient was identified and appropriate procedural time out was performed.  The patient was then placed supine on the table and prepped and draped in the usual sterile fashion. Moderate conscious sedation was administered during a face to face encounter with the patient throughout the procedure with my supervision of the RN administering medicines and monitoring the patient's  vital signs, pulse oximetry, telemetry and mental status throughout from the start of the procedure until the patient was taken to the recovery room. Ultrasound was used to evaluate the right common femoral artery.  It was patent .  A digital ultrasound image was acquired.  A Seldinger needle was used to access the right common femoral artery under direct ultrasound guidance and a permanent image was performed.  A 0.035 J wire was advanced without resistance and a 5Fr sheath was placed.  A J-wire and pigtail catheter would not initially cross the right common iliac artery lesion so imaging through the left femoral sheath was performed showing a string sign stenosis in the proximal right common iliac artery and disease in the distal aorta and proximal left common iliac artery but a little less on the left.  This was clearly going to need kissing balloon expandable stents.  The left femoral artery was then accessed without difficulty with a Seldinger needle under direct ultrasound guidance and a permanent was recorded.  A J-wire and 5 French sheath were placed.  We then crossed the stenosis on the left side without difficulty with an advantage wire and placed a pigtail catheter into the aorta and performed imaging. The renal arteries appeared patent.  The distal aorta and both common iliac arteries were heavily diseased.  On the right side, there was a greater than 95%  stenosis in the proximal common iliac artery and on the left side there was about an 80 to 90% left common iliac artery stenosis.  The distal common iliac arteries normalized and both external iliac arteries were fairly normal.  Both common femoral arteries had mild to moderate disease but this did not appear flow-limiting and more in the 30 to 40% range bilaterally. It was felt that it was in the patient's best interest to proceed with intervention after these images to avoid a second procedure and a larger amount of contrast and fluoroscopy based off  of the findings from the initial angiogram. The patient was systemically heparinized and a 7 French 23 cm sheath was then placed over the Terumo Advantage wire on the left.  On the right, a Kumpe catheter and advantage wire were used to cross the stenosis and get into the aorta without difficulty.  On the right, we also upsized to a 7 [place] 23 cm sheath but it would not cross the lesion.  I elected to predilate the lesion and a 5 mm diameter by 4 cm length balloon was inflated to 12 atm in the right common iliac artery.  The sheath was then parked up into the distal aorta.  I then selected an 8 mm diameter by 58 mm length lifestream stent on the right and a 7 mm diameter by 58 mm length lifestream stent on the left and these were inflated simultaneously about a centimeter up into the aorta and encompassing both lesions in their entirety in both common iliac arteries.  These were inflated to 12 atm.  Completion imaging showed less than 10% residual stenosis in both common iliac arteries and distal aorta with brisk flow.  I elected to terminate the procedure. The sheath was removed and StarClose closure device was deployed in the left femoral artery with excellent hemostatic result.  Similarly, the sheath was removed on the right side and StarClose closure device was deployed with excellent hemostatic result.  The patient was taken to the recovery room in stable condition having tolerated the procedure well. ? ?Findings:   ?            Aortogram: The renal arteries appeared patent.  The distal aorta and both common iliac arteries were heavily diseased.  On the right side, there was a greater than 95% stenosis in the proximal common iliac artery and on the left side there was about an 80 to 90% left common iliac artery stenosis.  The distal common iliac arteries normalized and both external iliac arteries were fairly normal.  Both common femoral arteries had mild to moderate disease but this did not appear  flow-limiting and more in the 30 to 40% range bilaterally. ?           ? ? ?Disposition: Patient was taken to the recovery room in stable condition having tolerated the procedure well. ? ?Complications: None ? ?Leotis Pain ?02/11/2022 ?3:03 PM ? ? ?This note was created with Dragon Medical transcription system. Any errors in dictation are purely unintentional.  ?

## 2022-02-11 NOTE — Progress Notes (Signed)
Dr. Lucky Cowboy spoke with pt. And her daughter re: procedural results. Both verbalized understanding of conversation. Follow -up appt. Made for 1 month from now.  ?

## 2022-02-11 NOTE — H&P (Signed)
?[facility] ?Admission History & Physical ? ?MRN : [id number] ? ?Karina Stone is a 81 y.o. ([birth year]) female who presents with chief complaint of No chief complaint on file. ?. ? ?History of Present Illness: Patient presents today for evaluation intervention for PAD.  Progression of claudication symptoms.  No limb threatening symptoms. ? ?No current facility-administered medications for this encounter.  ? ? ?Past Medical History:  ?Diagnosis Date  ? Arthritis   ? finger  ? Chronic diarrhea   ? Colon polyps   ? COPD (chronic obstructive pulmonary disease) ([place])   ? Diverticulosis   ? Dyspnea   ? GERD (gastroesophageal reflux disease)   ? Headache   ? Hypertension   ? Thoracic scoliosis   ? Vertigo   ? Wears dentures   ? full upper and lower  ? ? ?Past Surgical History:  ?Procedure Laterality Date  ? ABDOMINAL HYSTERECTOMY    ? BROW LIFT Bilateral 02/11/2017  ? Procedure: bilateral secondary closure surgical wound dehisence extensive;  Surgeon: Karle Starch, MD;  Location: [place];  Service: Ophthalmology;  Laterality: Bilateral;  ? CATARACT EXTRACTION W/PHACO Right 05/04/2019  ? Procedure: CATARACT EXTRACTION PHACO AND INTRAOCULAR LENS PLACEMENT ([place])  RIGHT;  Surgeon: Birder Robson, MD;  Location: [place];  Service: Ophthalmology;  Laterality: Right;  ? CATARACT EXTRACTION W/PHACO Left 05/25/2019  ? Procedure: CATARACT EXTRACTION PHACO AND INTRAOCULAR LENS PLACEMENT ([place]) LEFT;  Surgeon: Birder Robson, MD;  Location: [place];  Service: Ophthalmology;  Laterality: Left;  ? HERNIA REPAIR    ? ? ? ?Social History  ? ?Tobacco Use  ? Smoking status: Former  ?  Types: Cigarettes  ?  Quit date: 11/12/1991  ?  Years since quitting: 30.2  ? Smokeless tobacco: Never  ?Vaping Use  ? Vaping Use: Never used  ?Substance Use Topics  ? Alcohol use: No  ? Drug use: No  ? ? ? ?Family History  ?Problem Relation Age of Onset  ? Breast cancer Neg Hx   ? ? ?Allergies   ?Allergen Reactions  ? Codeine   ?  headache ?Other reaction(s): Headache  ? Amitriptyline Palpitations  ? ?REVIEW OF SYSTEMS (Negative unless checked) ?  ?Constitutional: '[]'$ Weight loss  '[]'$ Fever  '[]'$ Chills ?Cardiac: '[]'$ Chest pain   '[]'$ Chest pressure   '[]'$ Palpitations   '[]'$ Shortness of breath when laying flat   '[]'$ Shortness of breath at rest   '[]'$ Shortness of breath with exertion. ?Vascular:  '[x]'$ Pain in legs with walking   '[]'$ Pain in legs at rest   '[x]'$ Pain in legs when laying flat   '[x]'$ Claudication   '[]'$ Pain in feet when walking  '[]'$ Pain in feet at rest  '[]'$ Pain in feet when laying flat   '[]'$ History of DVT   '[]'$ Phlebitis   '[]'$ Swelling in legs   '[]'$ Varicose veins   '[]'$ Non-healing ulcers ?Pulmonary:   '[]'$ Uses home oxygen   '[]'$ Productive cough   '[]'$ Hemoptysis   '[]'$ Wheeze  '[]'$ COPD   '[]'$ Asthma ?Neurologic:  '[]'$ Dizziness  '[]'$ Blackouts   '[]'$ Seizures   '[]'$ History of stroke   '[]'$ History of TIA  '[]'$ Aphasia   '[]'$ Temporary blindness   '[]'$ Dysphagia   '[]'$ Weakness or numbness in arms   '[]'$ Weakness or numbness in legs ?Musculoskeletal:  '[x]'$ Arthritis   '[]'$ Joint swelling   '[x]'$ Joint pain   '[]'$ Low back pain ?Hematologic:  '[]'$ Easy bruising  '[]'$ Easy bleeding   '[]'$ Hypercoagulable state   '[]'$ Anemic  '[]'$ Hepatitis ?Gastrointestinal:  '[]'$ Blood in stool   '[]'$ Vomiting blood  '[]'$ Gastroesophageal reflux/heartburn   '[]'$ Abdominal pain ?Genitourinary:  '[]'$   Chronic kidney disease   '[]'$ Difficult urination  '[]'$ Frequent urination  '[]'$ Burning with urination   '[]'$ Hematuria ?Skin:  '[]'$ Rashes   '[]'$ Ulcers   '[]'$ Wounds ?Psychological:  '[]'$ History of anxiety   '[]'$  History of major depression. ? ? ?Physical Examination ? ?There were no vitals filed for this visit. ?There is no height or weight on file to calculate BMI. ?Gen: WD/WN, NAD ?Head: [place]/AT, No temporalis wasting.  ?Ear/Nose/Throat: Hearing grossly intact, nares w/o erythema or drainage, oropharynx w/o Erythema/Exudate,  ?Eyes: Conjunctiva clear, sclera non-icteric ?Neck: Trachea midline.  No JVD.  ?Pulmonary:  Good air movement, respirations not labored,  no use of accessory muscles.  ?Cardiac: RRR, normal S1, S2. ?Vascular:  ?Vessel Right Left  ?Radial Palpable Palpable  ?    ?    ?    ?    ?    ?    ?PT Palpable Palpable  ?DP Palpable Palpable  ? ? ?Musculoskeletal: M/S 5/5 throughout.  Extremities without ischemic changes.  No deformity or atrophy.  ?Neurologic: Sensation grossly intact in extremities.  Symmetrical.  Speech is fluent. Motor exam as listed above. ?Psychiatric: Judgment intact, Mood & affect appropriate for pt's clinical situation. ?Dermatologic: No rashes or ulcers noted.  No cellulitis or open wounds. ? ? ? ? ? ?CBC ?Lab Results  ?Component Value Date  ? WBC 14.6 (H) 06/09/2019  ? HGB 12.8 06/09/2019  ? HCT 39.6 06/09/2019  ? MCV 94.7 06/09/2019  ? PLT 320 06/09/2019  ? ? ?BMET ?   ?Component Value Date/Time  ? NA 139 06/09/2019 2224  ? K 3.0 (L) 06/09/2019 2224  ? K 3.6 03/24/2013 0814  ? CL 100 06/09/2019 2224  ? CO2 27 06/09/2019 2224  ? GLUCOSE 164 (H) 06/09/2019 2224  ? BUN 20 06/09/2019 2224  ? CREATININE 0.91 06/09/2019 2224  ? CALCIUM 9.1 06/09/2019 2224  ? GFRNONAA 60 (L) 06/09/2019 2224  ? GFRAA >60 06/09/2019 2224  ? ?CrCl cannot be calculated (Patient's most recent lab result is older than the maximum 21 days allowed.). ? ?COAG ?No results found for: INR, PROTIME ? ?Radiology ?No results found. ? ? ?Assessment/Plan ?Hyperlipidemia, mixed ?lipid control important in reducing the progression of atherosclerotic disease. Continue statin therapy ?  ?  ?Benign essential hypertension ?blood pressure control important in reducing the progression of atherosclerotic disease. On appropriate oral medications. ?  ?Atherosclerosis of native arteries of extremity with intermittent claudication ([place]) ?Although her ABIs are roughly stable, her symptoms have progressed.  She is interested in having intervention for her peripheral arterial disease.  With anticipation of aortoiliac disease, we may be able to treat both sides concomitantly.  I have  discussed the risks and benefits of the procedure.  Patient voices her understanding and is agreeable to proceed ? ? ?Leotis Pain, MD ? ?02/11/2022 ?12:54 PM ? ? ?  ? ?

## 2022-02-12 ENCOUNTER — Encounter: Payer: Self-pay | Admitting: Vascular Surgery

## 2022-03-15 ENCOUNTER — Encounter (INDEPENDENT_AMBULATORY_CARE_PROVIDER_SITE_OTHER): Payer: Self-pay | Admitting: Vascular Surgery

## 2022-03-15 ENCOUNTER — Ambulatory Visit (INDEPENDENT_AMBULATORY_CARE_PROVIDER_SITE_OTHER): Payer: PPO | Admitting: Vascular Surgery

## 2022-03-15 VITALS — BP 167/72 | HR 60 | Resp 16 | Wt 140.6 lb

## 2022-03-15 DIAGNOSIS — E782 Mixed hyperlipidemia: Secondary | ICD-10-CM

## 2022-03-15 DIAGNOSIS — I10 Essential (primary) hypertension: Secondary | ICD-10-CM

## 2022-03-15 DIAGNOSIS — I70213 Atherosclerosis of native arteries of extremities with intermittent claudication, bilateral legs: Secondary | ICD-10-CM | POA: Diagnosis not present

## 2022-03-15 NOTE — Progress Notes (Signed)
? ? ?MRN : [id number] ? ?Karina Stone is a 81 y.o. (Mar 06, 1941) female who presents with chief complaint of  ?Chief Complaint  ?Patient presents with  ? Follow-up  ?  [facility] follow up   ?. ? ?History of Present Illness: Patient returns today in follow up of her PAD and claudication.  Her claudication symptoms are markedly improved after kissing stents to bilateral common iliac arteries and distal aorta about a month ago.  She is having no problems.  She had no periprocedural complications and her access sites are well-healed.  No new complaints today. ? ?Current Outpatient Medications  ?Medication Sig Dispense Refill  ? aspirin 81 MG EC tablet Take by mouth.    ? atenolol-chlorthalidone (TENORETIC) 50-25 MG tablet Take 1 tablet by mouth daily.    ? clopidogrel (PLAVIX) 75 MG tablet Take 1 tablet (75 mg total) by mouth daily. 30 tablet 11  ? meclizine (ANTIVERT) 25 MG tablet Take 25 mg by mouth 3 (three) times daily as needed for dizziness.    ? pravastatin (PRAVACHOL) 20 MG tablet Take 20 mg by mouth daily.    ? traMADol (ULTRAM) 50 MG tablet Take 50 mg by mouth 2 (two) times daily as needed for moderate pain.     ? acetaminophen (TYLENOL) 325 MG tablet Take 650 mg by mouth every 6 (six) hours as needed for mild pain or moderate pain.    ? cephALEXin (KEFLEX) 250 MG capsule Take 1 capsule (250 mg total) by mouth 3 (three) times daily. (Patient not taking: Reported on 02/16/2021) 21 capsule 0  ? cholecalciferol (VITAMIN D) 1000 units tablet Take 2,000 Units by mouth daily. (Patient not taking: Reported on 02/16/2021)    ? omeprazole (PRILOSEC) 20 MG capsule Take 20 mg by mouth daily. (Patient not taking: Reported on 02/16/2021)    ? rosuvastatin (CRESTOR) 5 MG tablet Take by mouth.    ? vitamin B-12 (CYANOCOBALAMIN) 1000 MCG tablet Take 1,000 mcg by mouth daily.    ? ?No current facility-administered medications for this visit.  ? ? ?Past Medical History:  ?Diagnosis Date  ? Arthritis   ? finger  ? Chronic diarrhea   ? Colon  polyps   ? COPD (chronic obstructive pulmonary disease) ([place])   ? Diverticulosis   ? Dyspnea   ? GERD (gastroesophageal reflux disease)   ? Headache   ? Hypertension   ? Thoracic scoliosis   ? Vertigo   ? Wears dentures   ? full upper and lower  ? ? ?Past Surgical History:  ?Procedure Laterality Date  ? ABDOMINAL HYSTERECTOMY    ? BROW LIFT Bilateral 02/11/2017  ? Procedure: bilateral secondary closure surgical wound dehisence extensive;  Surgeon: Karle Starch, MD;  Location: [place];  Service: Ophthalmology;  Laterality: Bilateral;  ? CATARACT EXTRACTION W/PHACO Right 05/04/2019  ? Procedure: CATARACT EXTRACTION PHACO AND INTRAOCULAR LENS PLACEMENT ([place])  RIGHT;  Surgeon: Birder Robson, MD;  Location: [place];  Service: Ophthalmology;  Laterality: Right;  ? CATARACT EXTRACTION W/PHACO Left 05/25/2019  ? Procedure: CATARACT EXTRACTION PHACO AND INTRAOCULAR LENS PLACEMENT ([place]) LEFT;  Surgeon: Birder Robson, MD;  Location: [place];  Service: Ophthalmology;  Laterality: Left;  ? HERNIA REPAIR    ? LOWER EXTREMITY ANGIOGRAPHY Bilateral 02/11/2022  ? Procedure: Lower Extremity Angiography;  Surgeon: Algernon Huxley, MD;  Location: [place] [facility] [facility];  Service: Cardiovascular;  Laterality: Bilateral;  ? ? ? ?Social History  ? ?Tobacco Use  ? Smoking  status: Former  ?  Types: Cigarettes  ?  Quit date: 11/12/1991  ?  Years since quitting: 30.3  ? Smokeless tobacco: Never  ?Vaping Use  ? Vaping Use: Never used  ?Substance Use Topics  ? Alcohol use: No  ? Drug use: No  ? ?  ? ? ?Family History  ?Problem Relation Age of Onset  ? Breast cancer Neg Hx   ? ? ? ?Allergies  ?Allergen Reactions  ? Codeine   ?  headache ?Other reaction(s): Headache  ? Rosuvastatin   ?  Other reaction(s): Muscle Pain  ? Amitriptyline Palpitations  ? ? ?REVIEW OF SYSTEMS (Negative unless checked) ?  ?Constitutional: '[]'$ Weight loss  '[]'$ Fever  '[]'$ Chills ?Cardiac: '[]'$ Chest pain   '[]'$ Chest pressure   '[]'$ Palpitations    '[]'$ Shortness of breath when laying flat   '[]'$ Shortness of breath at rest   '[]'$ Shortness of breath with exertion. ?Vascular:  '[x]'$ Pain in legs with walking   '[]'$ Pain in legs at rest   '[x]'$ Pain in legs when laying flat   '[x]'$ Claudication   '[]'$ Pain in feet when walking  '[]'$ Pain in feet at rest  '[]'$ Pain in feet when laying flat   '[]'$ History of DVT   '[]'$ Phlebitis   '[]'$ Swelling in legs   '[]'$ Varicose veins   '[]'$ Non-healing ulcers ?Pulmonary:   '[]'$ Uses home oxygen   '[]'$ Productive cough   '[]'$ Hemoptysis   '[]'$ Wheeze  '[]'$ COPD   '[]'$ Asthma ?Neurologic:  '[]'$ Dizziness  '[]'$ Blackouts   '[]'$ Seizures   '[]'$ History of stroke   '[]'$ History of TIA  '[]'$ Aphasia   '[]'$ Temporary blindness   '[]'$ Dysphagia   '[]'$ Weakness or numbness in arms   '[]'$ Weakness or numbness in legs ?Musculoskeletal:  '[x]'$ Arthritis   '[]'$ Joint swelling   '[x]'$ Joint pain   '[]'$ Low back pain ?Hematologic:  '[]'$ Easy bruising  '[]'$ Easy bleeding   '[]'$ Hypercoagulable state   '[]'$ Anemic  '[]'$ Hepatitis ?Gastrointestinal:  '[]'$ Blood in stool   '[]'$ Vomiting blood  '[]'$ Gastroesophageal reflux/heartburn   '[]'$ Abdominal pain ?Genitourinary:  '[]'$ Chronic kidney disease   '[]'$ Difficult urination  '[]'$ Frequent urination  '[]'$ Burning with urination   '[]'$ Hematuria ?Skin:  '[]'$ Rashes   '[]'$ Ulcers   '[]'$ Wounds ?Psychological:  '[]'$ History of anxiety   '[]'$  History of major depression. ? ? ?Physical Examination ? ?BP (!) 167/72 (BP Location: Right Arm)   Pulse 60   Resp 16   Wt 140 lb 9.6 oz (63.8 kg)   BMI 23.40 kg/m?  ?Gen:  WD/WN, NAD.  Appears younger than stated age ?Head: [place]/AT, No temporalis wasting. ?Ear/Nose/Throat: Hearing grossly intact, nares w/o erythema or drainage ?Eyes: Conjunctiva clear. Sclera non-icteric ?Neck: Supple.  Trachea midline ?Pulmonary:  Good air movement, no use of accessory muscles.  ?Cardiac: RRR, no JVD ?Vascular:  ?Vessel Right Left  ?Radial Palpable Palpable  ?    ?    ?    ?    ?    ?    ?PT Palpable Palpable  ?DP Palpable Palpable  ? ?Gastrointestinal: soft, non-tender/non-distended. No guarding/reflex.  ?Musculoskeletal: M/S  5/5 throughout.  No deformity or atrophy.  No edema. ?Neurologic: Sensation grossly intact in extremities.  Symmetrical.  Speech is fluent.  ?Psychiatric: Judgment intact, Mood & affect appropriate for pt's clinical situation. ?Dermatologic: No rashes or ulcers noted.  No cellulitis or open wounds. ? ? ? ? ? ?Labs ?Recent Results (from the past 2160 hour(s))  ?BUN     Status: None  ? Collection Time: 02/11/22  1:32 PM  ?Result Value Ref Range  ? BUN 18 8 - 23 mg/dL  ?  Comment: Performed at  [facility], [address] [place], [place] [address]  ?Creatinine, serum     Status: None  ? Collection Time: 02/11/22  1:32 PM  ?Result Value Ref Range  ? Creatinine, Ser 0.94 0.44 - 1.00 mg/dL  ? GFR, Estimated >60 >60 mL/min  ?  Comment: (NOTE) ?Calculated using the CKD-EPI Creatinine Equation (2021) ?Performed at [facility], [place], ?[place] [address] ?  ? ? ?Radiology ?No results found. ? ?Assessment/Plan ?Hyperlipidemia, mixed ?lipid control important in reducing the progression of atherosclerotic disease. Continue statin therapy ?  ?  ?Benign essential hypertension ?blood pressure control important in reducing the progression of atherosclerotic disease. On appropriate oral medications. ? ?Atherosclerosis of native arteries of extremity with intermittent claudication ([place]) ?Symptoms of claudication are markedly improved after kissing stents to bilateral common iliac arteries and distal aorta.  Doing well.  Continue current medical regimen.  Recheck in 3 months. ? ? ? ?Leotis Pain, MD ? ?03/15/2022 ?11:18 AM ? ? ? ?This note was created with Dragon medical transcription system.  Any errors from dictation are purely unintentional  ?

## 2022-03-15 NOTE — Assessment & Plan Note (Signed)
Symptoms of claudication are markedly improved after kissing stents to bilateral common iliac arteries and distal aorta.  Doing well.  Continue current medical regimen.  Recheck in 3 months. ?

## 2022-05-07 DIAGNOSIS — G720 Drug-induced myopathy: Secondary | ICD-10-CM | POA: Diagnosis not present

## 2022-05-07 DIAGNOSIS — Z22322 Carrier or suspected carrier of Methicillin resistant Staphylococcus aureus: Secondary | ICD-10-CM | POA: Diagnosis not present

## 2022-05-07 DIAGNOSIS — T466X5A Adverse effect of antihyperlipidemic and antiarteriosclerotic drugs, initial encounter: Secondary | ICD-10-CM | POA: Diagnosis not present

## 2022-06-14 ENCOUNTER — Ambulatory Visit (INDEPENDENT_AMBULATORY_CARE_PROVIDER_SITE_OTHER): Payer: PPO

## 2022-06-14 ENCOUNTER — Encounter (INDEPENDENT_AMBULATORY_CARE_PROVIDER_SITE_OTHER): Payer: Self-pay

## 2022-06-14 ENCOUNTER — Ambulatory Visit (INDEPENDENT_AMBULATORY_CARE_PROVIDER_SITE_OTHER): Payer: PPO | Admitting: Vascular Surgery

## 2022-06-14 DIAGNOSIS — I70213 Atherosclerosis of native arteries of extremities with intermittent claudication, bilateral legs: Secondary | ICD-10-CM

## 2022-07-16 ENCOUNTER — Emergency Department: Payer: PPO

## 2022-07-16 ENCOUNTER — Emergency Department
Admission: EM | Admit: 2022-07-16 | Discharge: 2022-07-16 | Discharge status: Against Medical Advice | Payer: PPO | Attending: Emergency Medicine | Admitting: Emergency Medicine

## 2022-07-16 ENCOUNTER — Other Ambulatory Visit: Payer: Self-pay

## 2022-07-16 DIAGNOSIS — R531 Weakness: Secondary | ICD-10-CM | POA: Insufficient documentation

## 2022-07-16 DIAGNOSIS — Z5321 Procedure and treatment not carried out due to patient leaving prior to being seen by health care provider: Secondary | ICD-10-CM | POA: Insufficient documentation

## 2022-07-16 DIAGNOSIS — R519 Headache, unspecified: Secondary | ICD-10-CM | POA: Diagnosis not present

## 2022-07-16 DIAGNOSIS — Z20822 Contact with and (suspected) exposure to covid-19: Secondary | ICD-10-CM | POA: Diagnosis not present

## 2022-07-16 DIAGNOSIS — R110 Nausea: Secondary | ICD-10-CM | POA: Diagnosis not present

## 2022-07-16 DIAGNOSIS — E860 Dehydration: Secondary | ICD-10-CM | POA: Diagnosis not present

## 2022-07-16 DIAGNOSIS — I10 Essential (primary) hypertension: Secondary | ICD-10-CM | POA: Diagnosis not present

## 2022-07-16 LAB — BASIC METABOLIC PANEL
Anion gap: 15 (ref 5–15)
BUN: 12 mg/dL (ref 8–23)
CO2: 27 mmol/L (ref 22–32)
Calcium: 9.6 mg/dL (ref 8.9–10.3)
Chloride: 85 mmol/L — ABNORMAL LOW (ref 98–111)
Creatinine, Ser: 0.61 mg/dL (ref 0.44–1.00)
GFR, Estimated: >60 mL/min (ref >60)
Glucose, Bld: 130 mg/dL — ABNORMAL HIGH (ref 70–99)
Potassium: 2.7 mmol/L — CL (ref 3.5–5.1)
Sodium: 127 mmol/L — ABNORMAL LOW (ref 135–145)

## 2022-07-16 LAB — CBC
HCT: 39.9 % (ref 36.0–46.0)
Hemoglobin: 13.8 g/dL (ref 12.0–15.0)
MCH: 29.9 pg (ref 26.0–34.0)
MCHC: 34.6 g/dL (ref 30.0–36.0)
MCV: 86.6 fL (ref 80.0–100.0)
Platelets: 310 10*3/uL (ref 150–400)
RBC: 4.61 MIL/uL (ref 3.87–5.11)
RDW: 11.6 % (ref 11.5–15.5)
WBC: 6.7 10*3/uL (ref 4.0–10.5)
nRBC: 0 % (ref 0.0–0.2)

## 2022-07-16 LAB — SARS CORONAVIRUS 2 BY RT PCR: SARS Coronavirus 2 by RT PCR: NEGATIVE

## 2022-07-16 LAB — TROPONIN I (HIGH SENSITIVITY): Troponin I (High Sensitivity): 9 ng/L (ref <18)

## 2022-07-16 MED ORDER — ONDANSETRON 4 MG PO TBDP
4.0000 mg | ORAL_TABLET | Freq: Once | ORAL | Status: AC
  Administered 2022-07-16: 4 mg via ORAL
  Filled 2022-07-16: qty 1

## 2022-07-16 NOTE — ED Triage Notes (Signed)
EMS  brings pt in from for c/o generalized weakness

## 2022-07-16 NOTE — ED Triage Notes (Signed)
Pt presents via EMS from home c/o a headache for the past few days per pt report. Reports dull ache. Reports increasing weakness.

## 2022-07-17 DIAGNOSIS — I10 Essential (primary) hypertension: Secondary | ICD-10-CM | POA: Diagnosis not present

## 2022-07-17 DIAGNOSIS — E876 Hypokalemia: Secondary | ICD-10-CM | POA: Diagnosis not present

## 2022-07-17 DIAGNOSIS — E782 Mixed hyperlipidemia: Secondary | ICD-10-CM | POA: Diagnosis not present

## 2022-07-17 DIAGNOSIS — E538 Deficiency of other specified B group vitamins: Secondary | ICD-10-CM | POA: Diagnosis not present

## 2022-07-17 DIAGNOSIS — E559 Vitamin D deficiency, unspecified: Secondary | ICD-10-CM | POA: Diagnosis not present

## 2022-07-17 DIAGNOSIS — R531 Weakness: Secondary | ICD-10-CM | POA: Diagnosis not present

## 2022-07-22 DIAGNOSIS — E871 Hypo-osmolality and hyponatremia: Secondary | ICD-10-CM | POA: Diagnosis not present

## 2022-07-22 DIAGNOSIS — E876 Hypokalemia: Secondary | ICD-10-CM | POA: Diagnosis not present

## 2022-07-22 DIAGNOSIS — R5383 Other fatigue: Secondary | ICD-10-CM | POA: Diagnosis not present

## 2022-07-24 ENCOUNTER — Encounter (INDEPENDENT_AMBULATORY_CARE_PROVIDER_SITE_OTHER): Payer: Self-pay | Admitting: *Deleted

## 2022-08-02 DIAGNOSIS — I739 Peripheral vascular disease, unspecified: Secondary | ICD-10-CM | POA: Diagnosis not present

## 2022-08-02 DIAGNOSIS — E878 Other disorders of electrolyte and fluid balance, not elsewhere classified: Secondary | ICD-10-CM | POA: Diagnosis not present

## 2022-08-02 DIAGNOSIS — E782 Mixed hyperlipidemia: Secondary | ICD-10-CM | POA: Diagnosis not present

## 2022-08-02 DIAGNOSIS — R000 Tachycardia, unspecified: Secondary | ICD-10-CM | POA: Diagnosis not present

## 2022-08-02 DIAGNOSIS — Z0000 Encounter for general adult medical examination without abnormal findings: Secondary | ICD-10-CM | POA: Diagnosis not present

## 2022-08-02 DIAGNOSIS — Z23 Encounter for immunization: Secondary | ICD-10-CM | POA: Diagnosis not present

## 2022-08-21 DIAGNOSIS — E871 Hypo-osmolality and hyponatremia: Secondary | ICD-10-CM | POA: Insufficient documentation

## 2022-08-21 DIAGNOSIS — L570 Actinic keratosis: Secondary | ICD-10-CM | POA: Diagnosis not present

## 2022-08-21 DIAGNOSIS — E878 Other disorders of electrolyte and fluid balance, not elsewhere classified: Secondary | ICD-10-CM | POA: Diagnosis not present

## 2022-08-21 DIAGNOSIS — I10 Essential (primary) hypertension: Secondary | ICD-10-CM | POA: Diagnosis not present

## 2022-09-09 ENCOUNTER — Encounter (INDEPENDENT_AMBULATORY_CARE_PROVIDER_SITE_OTHER): Payer: Self-pay

## 2022-09-09 ENCOUNTER — Other Ambulatory Visit (INDEPENDENT_AMBULATORY_CARE_PROVIDER_SITE_OTHER): Payer: Self-pay | Admitting: Vascular Surgery

## 2022-09-09 DIAGNOSIS — Z9889 Other specified postprocedural states: Secondary | ICD-10-CM

## 2022-09-13 ENCOUNTER — Ambulatory Visit (INDEPENDENT_AMBULATORY_CARE_PROVIDER_SITE_OTHER): Payer: PPO

## 2022-09-13 ENCOUNTER — Encounter (INDEPENDENT_AMBULATORY_CARE_PROVIDER_SITE_OTHER): Payer: Self-pay | Admitting: Nurse Practitioner

## 2022-09-13 ENCOUNTER — Ambulatory Visit (INDEPENDENT_AMBULATORY_CARE_PROVIDER_SITE_OTHER): Payer: PPO | Admitting: Nurse Practitioner

## 2022-09-13 VITALS — BP 184/79 | HR 76 | Resp 16 | Wt 136.4 lb

## 2022-09-13 DIAGNOSIS — I739 Peripheral vascular disease, unspecified: Secondary | ICD-10-CM

## 2022-09-13 DIAGNOSIS — I10 Essential (primary) hypertension: Secondary | ICD-10-CM

## 2022-09-13 DIAGNOSIS — Z9889 Other specified postprocedural states: Secondary | ICD-10-CM

## 2022-09-13 DIAGNOSIS — E782 Mixed hyperlipidemia: Secondary | ICD-10-CM

## 2022-10-06 ENCOUNTER — Encounter (INDEPENDENT_AMBULATORY_CARE_PROVIDER_SITE_OTHER): Payer: Self-pay | Admitting: Nurse Practitioner

## 2022-10-06 NOTE — Progress Notes (Signed)
Subjective:    Patient ID: Karina Stone, female    DOB: May 12, 1941, 81 y.o.   MRN: [id number] Chief Complaint  Patient presents with   Follow-up    Ultrasound follow up    The patient returns to the office for followup and review of the noninvasive studies.   There have been no interval changes in lower extremity symptoms. No interval shortening of the patient's claudication distance or development of rest pain symptoms. No new ulcers or wounds have occurred since the last visit.  There have been no significant changes to the patient's overall health care.  The patient denies amaurosis fugax or recent TIA symptoms. There are no documented recent neurological changes noted. There is no history of DVT, PE or superficial thrombophlebitis. The patient denies recent episodes of angina or shortness of breath.   ABI Rt=1.13 and Lt=1.13  (previous ABI's Rt=1.04 and Lt=1.05) Duplex ultrasound of the right lower extremity shows triphasic tibial artery waveforms with biphasic/triphasic waveforms in the left lower extremity.  Patient has normal toe waveforms bilaterally.    Review of Systems  All other systems reviewed and are negative.      Objective:   Physical Exam Vitals reviewed.  HENT:     Head: Normocephalic.  Cardiovascular:     Rate and Rhythm: Normal rate.     Pulses: Normal pulses.  Pulmonary:     Effort: Pulmonary effort is normal.  Skin:    General: Skin is warm and dry.  Neurological:     Mental Status: She is alert and oriented to person, place, and time.  Psychiatric:        Mood and Affect: Mood normal.        Behavior: Behavior normal.        Thought Content: Thought content normal.        Judgment: Judgment normal.     BP (!) 184/79 (BP Location: Right Arm)   Pulse 76   Resp 16   Wt 136 lb 6.4 oz (61.9 kg)   BMI 22.70 kg/m   Past Medical History:  Diagnosis Date   Arthritis    finger   Chronic diarrhea    Colon polyps    COPD (chronic  obstructive pulmonary disease) ([facility])    Diverticulosis    Dyspnea    GERD (gastroesophageal reflux disease)    Headache    Hypertension    Thoracic scoliosis    Vertigo    Wears dentures    full upper and lower    Social History   Socioeconomic History   Marital status: Widowed    Spouse name: Not on file   Number of children: Not on file   Years of education: Not on file   Highest education level: Not on file  Occupational History   Not on file  Tobacco Use   Smoking status: Former    Types: Cigarettes    Quit date: 11/12/1991    Years since quitting: 30.9   Smokeless tobacco: Never  Vaping Use   Vaping Use: Never used  Substance and Sexual Activity   Alcohol use: No   Drug use: No   Sexual activity: Not on file  Other Topics Concern   Not on file  Social History Narrative   Not on file   Social Determinants of Health   Financial Resource Strain: Not on file  Food Insecurity: Not on file  Transportation Needs: Not on file  Physical Activity: Not on file  Stress: Not  on file  Social Connections: Not on file  Intimate Partner Violence: Not on file    Past Surgical History:  Procedure Laterality Date   ABDOMINAL HYSTERECTOMY     BROW LIFT Bilateral 02/11/2017   Procedure: bilateral secondary closure surgical wound dehisence extensive;  Surgeon: Karle Starch, MD;  Location: [place];  Service: Ophthalmology;  Laterality: Bilateral;   CATARACT EXTRACTION W/PHACO Right 05/04/2019   Procedure: CATARACT EXTRACTION PHACO AND INTRAOCULAR LENS PLACEMENT ([place])  RIGHT;  Surgeon: Birder Robson, MD;  Location: [facility];  Service: Ophthalmology;  Laterality: Right;   CATARACT EXTRACTION W/PHACO Left 05/25/2019   Procedure: CATARACT EXTRACTION PHACO AND INTRAOCULAR LENS PLACEMENT ([place]) LEFT;  Surgeon: Birder Robson, MD;  Location: [facility];  Service: Ophthalmology;  Laterality: Left;   HERNIA REPAIR     LOWER EXTREMITY ANGIOGRAPHY  Bilateral 02/11/2022   Procedure: Lower Extremity Angiography;  Surgeon: Algernon Huxley, MD;  Location: [facility];  Service: Cardiovascular;  Laterality: Bilateral;    Family History  Problem Relation Age of Onset   Breast cancer Neg Hx     Allergies  Allergen Reactions   Chlorthalidone Other (See Comments)    Electrolyte problems   Codeine     headache Other reaction(s): Headache   Rosuvastatin     Other reaction(s): Muscle Pain   Amitriptyline Palpitations       Latest Ref Rng & Units 07/16/2022    7:53 PM 06/09/2019   10:24 PM 11/11/2018    6:07 AM  CBC  WBC 4.0 - 10.5 K/uL 6.7  14.6  8.0   Hemoglobin 12.0 - 15.0 g/dL 13.8  12.8  11.7   Hematocrit 36.0 - 46.0 % 39.9  39.6  36.0   Platelets 150 - 400 K/uL 310  320  200       CMP     Component Value Date/Time   NA 127 (L) 07/16/2022 1953   K 2.7 (LL) 07/16/2022 1953   K 3.6 03/24/2013 0814   CL 85 (L) 07/16/2022 1953   CO2 27 07/16/2022 1953   GLUCOSE 130 (H) 07/16/2022 1953   BUN 12 07/16/2022 1953   CREATININE 0.61 07/16/2022 1953   CALCIUM 9.6 07/16/2022 1953   PROT 7.4 11/10/2018 1018   ALBUMIN 3.9 11/10/2018 1018   AST 18 11/10/2018 1018   ALT 12 11/10/2018 1018   ALKPHOS 64 11/10/2018 1018   BILITOT 0.5 11/10/2018 1018   GFRNONAA >60 07/16/2022 1953   GFRAA >60 06/09/2019 2224     VAS [place] ABI WITH/WO TBI  Result Date: 09/15/2022  LOWER EXTREMITY DOPPLER STUDY Patient Name:  Karina Stone  Date of Exam:   09/13/2022 Medical Rec #: [id number]     Accession #:    [id number] Date of Birth: [birth year]     Patient Gender: F Patient Age:   44 years Exam Location:  [facility] Procedure:      VAS [place] ABI WITH/WO TBI Referring Phys: --------------------------------------------------------------------------------   Vascular Interventions: 02/11/2022 Kissing balloon expandable stent placements to                         bilateral common iliac arteries in the distal aorta                         using an  8 mm diameter by 58 mm length lifestream stent  on the right and a 7 mm diameter by 58 mm length                         lifestream stent on the left. Comparison Study: 06/14/2022 Performing Technologist: Concha Norway RVT  Examination Guidelines: A complete evaluation includes at minimum, Doppler waveform signals and systolic blood pressure reading at the level of bilateral brachial, anterior tibial, and posterior tibial arteries, when vessel segments are accessible. Bilateral testing is considered an integral part of a complete examination. Photoelectric Plethysmograph (PPG) waveforms and toe systolic pressure readings are included as required and additional duplex testing as needed. Limited examinations for reoccurring indications may be performed as noted.  ABI Findings: +---------+------------------+-----+---------+--------+ Right    Rt Pressure (mmHg)IndexWaveform Comment  +---------+------------------+-----+---------+--------+ Brachial 150                                      +---------+------------------+-----+---------+--------+ ATA      165               1.10 triphasic         +---------+------------------+-----+---------+--------+ PTA      169               1.13 triphasic         +---------+------------------+-----+---------+--------+ Great Toe153               1.02 Normal            +---------+------------------+-----+---------+--------+ +---------+------------------+-----+---------+-------+ Left     Lt Pressure (mmHg)IndexWaveform Comment +---------+------------------+-----+---------+-------+ Brachial 146                                     +---------+------------------+-----+---------+-------+ ATA      152               1.01 biphasic         +---------+------------------+-----+---------+-------+ PTA      169               1.13 triphasic        +---------+------------------+-----+---------+-------+ Great Toe158                1.05 Normal           +---------+------------------+-----+---------+-------+ +-------+-----------+-----------+------------+------------+ ABI/TBIToday's ABIToday's TBIPrevious ABIPrevious TBI +-------+-----------+-----------+------------+------------+ Right  1.13       1.02       1.04        1.05         +-------+-----------+-----------+------------+------------+ Left   1.13       1.05       1.05        1.01         +-------+-----------+-----------+------------+------------+ Bilateral ABIs appear essentially unchanged compared to prior study on 06/14/2022.  Summary: Right: Resting right ankle-brachial index is within normal range. The right toe-brachial index is normal. Left: Resting left ankle-brachial index is within normal range. The left toe-brachial index is normal. *See table(s) above for measurements and observations.  Electronically signed by Leotis Pain MD on 09/15/2022 at 7:16:16 PM.    Final        Assessment & Plan:   1. Peripheral arterial disease with history of revascularization (HCC)  Recommend:  The patient has evidence of atherosclerosis of the lower extremities with claudication.  The patient does not voice lifestyle limiting  changes at this point in time.  Noninvasive studies do not suggest clinically significant change.  No invasive studies, angiography or surgery at this time The patient should continue walking and begin a more formal exercise program.  The patient should continue antiplatelet therapy and aggressive treatment of the lipid abnormalities  No changes in the patient's medications at this time  Continued surveillance is indicated as atherosclerosis is likely to progress with time.    The patient will continue follow up with noninvasive studies as ordered.   2. Benign essential hypertension Continue antihypertensive medications as already ordered, these medications have been reviewed and there are no changes at this time.  3. Hyperlipidemia,  mixed Continue statin as ordered and reviewed, no changes at this time   Current Outpatient Medications on File Prior to Visit  Medication Sig Dispense Refill   ALPRAZolam (XANAX) 0.25 MG tablet Take 0.25 mg by mouth 2 (two) times daily as needed.     amLODipine (NORVASC) 5 MG tablet Take 5 mg by mouth at bedtime.     aspirin 81 MG EC tablet Take by mouth.     bisoprolol (ZEBETA) 5 MG tablet Take 5 mg by mouth daily.     clopidogrel (PLAVIX) 75 MG tablet Take 1 tablet (75 mg total) by mouth daily. 30 tablet 11   meclizine (ANTIVERT) 25 MG tablet Take 25 mg by mouth 3 (three) times daily as needed for dizziness.     olmesartan (BENICAR) 20 MG tablet Take 20 mg by mouth daily.     traMADol (ULTRAM) 50 MG tablet Take 50 mg by mouth 2 (two) times daily as needed for moderate pain.      acetaminophen (TYLENOL) 325 MG tablet Take 650 mg by mouth every 6 (six) hours as needed for mild pain or moderate pain.     atenolol-chlorthalidone (TENORETIC) 50-25 MG tablet Take 1 tablet by mouth daily. (Patient not taking: Reported on 09/13/2022)     cephALEXin (KEFLEX) 250 MG capsule Take 1 capsule (250 mg total) by mouth 3 (three) times daily. (Patient not taking: Reported on 02/16/2021) 21 capsule 0   cholecalciferol (VITAMIN D) 1000 units tablet Take 2,000 Units by mouth daily. (Patient not taking: Reported on 02/16/2021)     omeprazole (PRILOSEC) 20 MG capsule Take 20 mg by mouth daily. (Patient not taking: Reported on 02/16/2021)     pravastatin (PRAVACHOL) 20 MG tablet Take 20 mg by mouth daily. (Patient not taking: Reported on 09/13/2022)     rosuvastatin (CRESTOR) 5 MG tablet Take by mouth.     vitamin B-12 (CYANOCOBALAMIN) 1000 MCG tablet Take 1,000 mcg by mouth daily.     No current facility-administered medications on file prior to visit.    There are no Patient Instructions on file for this visit. No follow-ups on file.   Kris Hartmann, NP

## 2022-10-24 DIAGNOSIS — R0989 Other specified symptoms and signs involving the circulatory and respiratory systems: Secondary | ICD-10-CM | POA: Diagnosis not present

## 2022-10-24 DIAGNOSIS — E860 Dehydration: Secondary | ICD-10-CM | POA: Diagnosis not present

## 2022-10-24 DIAGNOSIS — R350 Frequency of micturition: Secondary | ICD-10-CM | POA: Diagnosis not present

## 2022-10-24 DIAGNOSIS — R5383 Other fatigue: Secondary | ICD-10-CM | POA: Diagnosis not present

## 2022-10-24 DIAGNOSIS — E878 Other disorders of electrolyte and fluid balance, not elsewhere classified: Secondary | ICD-10-CM | POA: Diagnosis not present

## 2022-10-24 DIAGNOSIS — Z20828 Contact with and (suspected) exposure to other viral communicable diseases: Secondary | ICD-10-CM | POA: Diagnosis not present

## 2022-10-24 DIAGNOSIS — R519 Headache, unspecified: Secondary | ICD-10-CM | POA: Diagnosis not present

## 2023-01-27 DIAGNOSIS — E782 Mixed hyperlipidemia: Secondary | ICD-10-CM | POA: Diagnosis not present

## 2023-02-06 DIAGNOSIS — Z0000 Encounter for general adult medical examination without abnormal findings: Secondary | ICD-10-CM | POA: Diagnosis not present

## 2023-02-06 DIAGNOSIS — I700 Atherosclerosis of aorta: Secondary | ICD-10-CM | POA: Diagnosis not present

## 2023-02-06 DIAGNOSIS — G309 Alzheimer's disease, unspecified: Secondary | ICD-10-CM | POA: Diagnosis not present

## 2023-02-06 DIAGNOSIS — E782 Mixed hyperlipidemia: Secondary | ICD-10-CM | POA: Diagnosis not present

## 2023-02-06 DIAGNOSIS — F0670 Mild neurocognitive disorder due to known physiological condition without behavioral disturbance: Secondary | ICD-10-CM | POA: Diagnosis not present

## 2023-02-06 DIAGNOSIS — R739 Hyperglycemia, unspecified: Secondary | ICD-10-CM | POA: Diagnosis not present

## 2023-02-06 DIAGNOSIS — E538 Deficiency of other specified B group vitamins: Secondary | ICD-10-CM | POA: Diagnosis not present

## 2023-02-10 ENCOUNTER — Other Ambulatory Visit (INDEPENDENT_AMBULATORY_CARE_PROVIDER_SITE_OTHER): Payer: Self-pay | Admitting: Vascular Surgery

## 2023-02-11 ENCOUNTER — Emergency Department: Payer: PPO

## 2023-02-11 ENCOUNTER — Emergency Department
Admission: EM | Admit: 2023-02-11 | Discharge: 2023-02-11 | Disposition: A | Discharge status: Home / Self Care | Payer: PPO | Attending: Emergency Medicine | Admitting: Emergency Medicine

## 2023-02-11 ENCOUNTER — Other Ambulatory Visit: Payer: Self-pay

## 2023-02-11 DIAGNOSIS — R1110 Vomiting, unspecified: Secondary | ICD-10-CM | POA: Diagnosis not present

## 2023-02-11 DIAGNOSIS — I10 Essential (primary) hypertension: Secondary | ICD-10-CM | POA: Diagnosis not present

## 2023-02-11 DIAGNOSIS — J449 Chronic obstructive pulmonary disease, unspecified: Secondary | ICD-10-CM | POA: Insufficient documentation

## 2023-02-11 DIAGNOSIS — E860 Dehydration: Secondary | ICD-10-CM | POA: Insufficient documentation

## 2023-02-11 DIAGNOSIS — R531 Weakness: Secondary | ICD-10-CM | POA: Insufficient documentation

## 2023-02-11 DIAGNOSIS — Z1152 Encounter for screening for COVID-19: Secondary | ICD-10-CM | POA: Diagnosis not present

## 2023-02-11 LAB — BASIC METABOLIC PANEL
Anion gap: 10 (ref 5–15)
BUN: 14 mg/dL (ref 8–23)
CO2: 26 mmol/L (ref 22–32)
Calcium: 9.7 mg/dL (ref 8.9–10.3)
Chloride: 97 mmol/L — ABNORMAL LOW (ref 98–111)
Creatinine, Ser: 0.68 mg/dL (ref 0.44–1.00)
GFR, Estimated: >60 mL/min (ref >60)
Glucose, Bld: 135 mg/dL — ABNORMAL HIGH (ref 70–99)
Potassium: 3.7 mmol/L (ref 3.5–5.1)
Sodium: 133 mmol/L — ABNORMAL LOW (ref 135–145)

## 2023-02-11 LAB — URINALYSIS, ROUTINE W REFLEX MICROSCOPIC
Bacteria, UA: NONE SEEN
Bilirubin Urine: NEGATIVE
Glucose, UA: NEGATIVE mg/dL
Hgb urine dipstick: NEGATIVE
Ketones, ur: NEGATIVE mg/dL
Nitrite: NEGATIVE
Protein, ur: NEGATIVE mg/dL
Specific Gravity, Urine: 1.005 (ref 1.005–1.030)
pH: 7 (ref 5.0–8.0)

## 2023-02-11 LAB — HEPATIC FUNCTION PANEL
ALT: 12 U/L (ref 0–44)
AST: 25 U/L (ref 15–41)
Albumin: 4.5 g/dL (ref 3.5–5.0)
Alkaline Phosphatase: 48 U/L (ref 38–126)
Bilirubin, Direct: <0.1 mg/dL (ref 0.0–0.2)
Total Bilirubin: 0.8 mg/dL (ref 0.3–1.2)
Total Protein: 7.6 g/dL (ref 6.5–8.1)

## 2023-02-11 LAB — MAGNESIUM: Magnesium: 1.8 mg/dL (ref 1.7–2.4)

## 2023-02-11 LAB — CBC
HCT: 39.5 % (ref 36.0–46.0)
Hemoglobin: 13.1 g/dL (ref 12.0–15.0)
MCH: 30.1 pg (ref 26.0–34.0)
MCHC: 33.2 g/dL (ref 30.0–36.0)
MCV: 90.8 fL (ref 80.0–100.0)
Platelets: 264 10*3/uL (ref 150–400)
RBC: 4.35 MIL/uL (ref 3.87–5.11)
RDW: 12.5 % (ref 11.5–15.5)
WBC: 6.8 10*3/uL (ref 4.0–10.5)
nRBC: 0 % (ref 0.0–0.2)

## 2023-02-11 LAB — CK: Total CK: 163 U/L (ref 38–234)

## 2023-02-11 LAB — LIPASE, BLOOD: Lipase: 39 U/L (ref 11–51)

## 2023-02-11 LAB — RESP PANEL BY RT-PCR (RSV, FLU A&B, COVID)  RVPGX2
Influenza A by PCR: NEGATIVE
Influenza B by PCR: NEGATIVE
Resp Syncytial Virus by PCR: NEGATIVE
SARS Coronavirus 2 by RT PCR: NEGATIVE

## 2023-02-11 LAB — TROPONIN I (HIGH SENSITIVITY)
Troponin I (High Sensitivity): 6 ng/L (ref <18)
Troponin I (High Sensitivity): 7 ng/L (ref <18)

## 2023-02-11 LAB — TSH: TSH: 1.186 u[IU]/mL (ref 0.350–4.500)

## 2023-02-11 LAB — T4, FREE: Free T4: 0.82 ng/dL (ref 0.61–1.12)

## 2023-02-11 MED ORDER — SODIUM CHLORIDE 0.9 % IV BOLUS
500.0000 mL | Freq: Once | INTRAVENOUS | Status: AC
  Administered 2023-02-11: 500 mL via INTRAVENOUS

## 2023-02-11 MED ORDER — ONDANSETRON HCL 4 MG/2ML IJ SOLN
4.0000 mg | Freq: Once | INTRAMUSCULAR | Status: AC
  Administered 2023-02-11: 4 mg via INTRAVENOUS
  Filled 2023-02-11: qty 2

## 2023-02-11 MED ORDER — ACETAMINOPHEN 500 MG PO TABS
1000.0000 mg | ORAL_TABLET | Freq: Once | ORAL | Status: AC
  Administered 2023-02-11: 1000 mg via ORAL
  Filled 2023-02-11: qty 2

## 2023-02-11 NOTE — Discharge Instructions (Signed)
Your workup was reassuring with reassuring labs but there were slightly low sodium and chloride but nothing that looked as bad as it was last time.  Continue to drink plenty of fluids including Gatorade, Pedialyte to stay well-hydrated and return to the ER if you develop fevers, worsening symptoms or any other concerns

## 2023-02-11 NOTE — ED Provider Notes (Signed)
[facility] Provider Note    Event Date/Time   First MD Initiated Contact with Patient 02/11/23 [phone]     (approximate)   History   Emesis   HPI  Karina Stone is a 82 y.o. female with history of COPD, hypertension, peripheral artery disease who comes in with vomiting.  Patient reports 1 episode of vomiting states that she feels like she is dehydrated.  Patient's sister is at bedside.  She reports that over the past week they have noticed some gradually increasing weakness.  They report that today was more difficult to get her to stand up.  They report that she had a history of this previously when she has been dehydrated and had low sodium and other low electrolytes.  She denies any falls hitting her head denies any cough, shortness of breath, denies any abdominal pain.  She did have 1 episode of vomiting this morning but denies any additional vomiting or diarrhea.  Reports some decreased p.o. intake.   Physical Exam   Triage Vital Signs: ED Triage Vitals [02/11/23 0909]  Enc Vitals Group     BP (!) 180/90     Pulse Rate 63     Resp 19     Temp 97.9 F (36.6 C)     Temp Source Oral     SpO2 95 %     Weight      Height      Head Circumference      Peak Flow      Pain Score 0     Pain Loc      Pain Edu?      Excl. in [place]?     Most recent vital signs: Vitals:   02/11/23 0909  BP: (!) 180/90  Pulse: 63  Resp: 19  Temp: 97.9 F (36.6 C)  SpO2: 95%     General: Awake, no distress.  CV:  Good peripheral perfusion.  Resp:  Normal effort.  Abd:  No distention.  Soft and nontender Other:  Moving all extremities well.  No swelling in legs.   ED Results / Procedures / Treatments   Labs (all labs ordered are listed, but only abnormal results are displayed) Labs Reviewed  BASIC METABOLIC PANEL - Abnormal; Notable for the following components:      Result Value   Sodium 133 (*)    Chloride 97 (*)    Glucose, Bld 135 (*)    All other  components within normal limits  URINALYSIS, ROUTINE W REFLEX MICROSCOPIC - Abnormal; Notable for the following components:   Color, Urine STRAW (*)    APPearance CLEAR (*)    Leukocytes,Ua TRACE (*)    All other components within normal limits  RESP PANEL BY RT-PCR (RSV, FLU A&B, COVID)  RVPGX2  CBC  HEPATIC FUNCTION PANEL  LIPASE, BLOOD  TSH  T4, FREE  MAGNESIUM  CK  TROPONIN I (HIGH SENSITIVITY)  TROPONIN I (HIGH SENSITIVITY)     EKG  My interpretation of EKG:  Sinus bradycardia rate of 57 without any ST elevation or T wave inversions, normal intervals  RADIOLOGY I have reviewed the CT head personally and interpretted no intracranial hemorrhage   PROCEDURES:  Critical Care performed: No  Procedures   MEDICATIONS ORDERED IN ED: Medications - No data to display   IMPRESSION / MDM / [place] / ED COURSE  I reviewed the triage vital signs and the nursing notes.   Patient's presentation is most consistent  with acute presentation with potential threat to life or bodily function.   Patient comes in with generalized weakness concern for for some dehydration.  Will get labs to evaluate for electrolytes, AKI, thyroid dysfunction, rhabdo, UTI.  Abdomen soft nontender low suspicion for acute abdominal process.  Seems to be moving everything well.  Offered chest x-ray to evaluate for any pneumonia patient declined denies any cough, shortness of breath.  BMP shows slightly low sodium and chloride patient getting some IV fluids CBC reassuring hepatic function normal lipase normal troponin negative thyroid normal mag normal CK normal  Reevaluated patient and she does report a dull moderate headache.  She denies this being the worst headache of her life.  She reports a history of headaches and migraines and this not being as bad as it typically is but is requesting something to help with her headache denies any falls and hitting her head.  Given her age we did discuss a  CT scan just to ensure is nothing else acute going on does not sound like subarachnoid.  She has full range of motion of neck denies any blurred vision has no temporal artery tenderness.  Patient was given a dose of Tylenol  2:34 PM On repeat evaluation headache is better.  She again reports that this is a typical headache for her denies any concerns for it.  She is been up and ambulatory to the bathroom without any weakness.  She reports feeling better.  We discussed the reassuring workup and she feels comfortable with discharge home  The patient is on the cardiac monitor to evaluate for evidence of arrhythmia and/or significant heart rate changes.      FINAL CLINICAL IMPRESSION(S) / ED DIAGNOSES   Final diagnoses:  Dehydration  Weakness     Rx / DC Orders   ED Discharge Orders     None        Note:  This document was prepared using Dragon voice recognition software and may include unintentional dictation errors.   Vanessa [place], MD 02/11/23 1434

## 2023-02-11 NOTE — ED Triage Notes (Signed)
Pt presents to ED with c/o of one bout of vomiting and states "I think I'm dehydrated this has happened before". Pt is A&Ox4. NAD noted.

## 2023-02-12 ENCOUNTER — Emergency Department: Payer: PPO

## 2023-02-12 ENCOUNTER — Other Ambulatory Visit: Payer: Self-pay

## 2023-02-12 ENCOUNTER — Encounter: Payer: Self-pay | Admitting: *Deleted

## 2023-02-12 DIAGNOSIS — R1111 Vomiting without nausea: Secondary | ICD-10-CM | POA: Diagnosis not present

## 2023-02-12 DIAGNOSIS — Z79899 Other long term (current) drug therapy: Secondary | ICD-10-CM | POA: Diagnosis not present

## 2023-02-12 DIAGNOSIS — R531 Weakness: Secondary | ICD-10-CM | POA: Diagnosis not present

## 2023-02-12 DIAGNOSIS — J90 Pleural effusion, not elsewhere classified: Secondary | ICD-10-CM | POA: Diagnosis not present

## 2023-02-12 DIAGNOSIS — Z5321 Procedure and treatment not carried out due to patient leaving prior to being seen by health care provider: Secondary | ICD-10-CM | POA: Insufficient documentation

## 2023-02-12 DIAGNOSIS — E871 Hypo-osmolality and hyponatremia: Secondary | ICD-10-CM | POA: Diagnosis not present

## 2023-02-12 DIAGNOSIS — J449 Chronic obstructive pulmonary disease, unspecified: Secondary | ICD-10-CM | POA: Diagnosis not present

## 2023-02-12 DIAGNOSIS — R1110 Vomiting, unspecified: Secondary | ICD-10-CM | POA: Diagnosis not present

## 2023-02-12 DIAGNOSIS — I159 Secondary hypertension, unspecified: Secondary | ICD-10-CM | POA: Insufficient documentation

## 2023-02-12 DIAGNOSIS — R110 Nausea: Secondary | ICD-10-CM | POA: Diagnosis not present

## 2023-02-12 DIAGNOSIS — E860 Dehydration: Secondary | ICD-10-CM | POA: Diagnosis not present

## 2023-02-12 DIAGNOSIS — F0390 Unspecified dementia without behavioral disturbance: Secondary | ICD-10-CM | POA: Insufficient documentation

## 2023-02-12 DIAGNOSIS — G4489 Other headache syndrome: Secondary | ICD-10-CM | POA: Diagnosis not present

## 2023-02-12 DIAGNOSIS — I10 Essential (primary) hypertension: Secondary | ICD-10-CM | POA: Diagnosis not present

## 2023-02-12 LAB — CBC
HCT: 40.9 % (ref 36.0–46.0)
Hemoglobin: 13.8 g/dL (ref 12.0–15.0)
MCH: 30.4 pg (ref 26.0–34.0)
MCHC: 33.7 g/dL (ref 30.0–36.0)
MCV: 90.1 fL (ref 80.0–100.0)
Platelets: 311 10*3/uL (ref 150–400)
RBC: 4.54 MIL/uL (ref 3.87–5.11)
RDW: 12.3 % (ref 11.5–15.5)
WBC: 7.2 10*3/uL (ref 4.0–10.5)
nRBC: 0 % (ref 0.0–0.2)

## 2023-02-12 LAB — BASIC METABOLIC PANEL
Anion gap: 14 (ref 5–15)
BUN: 13 mg/dL (ref 8–23)
CO2: 23 mmol/L (ref 22–32)
Calcium: 9.7 mg/dL (ref 8.9–10.3)
Chloride: 89 mmol/L — ABNORMAL LOW (ref 98–111)
Creatinine, Ser: 0.72 mg/dL (ref 0.44–1.00)
GFR, Estimated: >60 mL/min (ref >60)
Glucose, Bld: 152 mg/dL — ABNORMAL HIGH (ref 70–99)
Potassium: 3.4 mmol/L — ABNORMAL LOW (ref 3.5–5.1)
Sodium: 126 mmol/L — ABNORMAL LOW (ref 135–145)

## 2023-02-12 LAB — TROPONIN I (HIGH SENSITIVITY): Troponin I (High Sensitivity): 6 ng/L (ref <18)

## 2023-02-12 LAB — MAGNESIUM: Magnesium: 1.9 mg/dL (ref 1.7–2.4)

## 2023-02-12 NOTE — ED Triage Notes (Signed)
Pt brought in via ems from home.  Pt reports vomiting today.  Pt was seen in er yesterday with similar sx.  Pt states she doesn't feel any better.  Pt alert.

## 2023-02-13 ENCOUNTER — Emergency Department
Admission: EM | Admit: 2023-02-13 | Discharge: 2023-02-13 | Discharge status: Against Medical Advice | Payer: PPO | Attending: Emergency Medicine | Admitting: Emergency Medicine

## 2023-02-13 ENCOUNTER — Emergency Department
Admission: EM | Admit: 2023-02-13 | Discharge: 2023-02-13 | Disposition: A | Discharge status: Home / Self Care | Payer: PPO | Source: Home / Self Care | Attending: Student in an Organized Health Care Education/Training Program | Admitting: Student in an Organized Health Care Education/Training Program

## 2023-02-13 ENCOUNTER — Other Ambulatory Visit: Payer: Self-pay

## 2023-02-13 ENCOUNTER — Encounter: Payer: Self-pay | Admitting: Emergency Medicine

## 2023-02-13 ENCOUNTER — Emergency Department: Payer: PPO

## 2023-02-13 DIAGNOSIS — E871 Hypo-osmolality and hyponatremia: Secondary | ICD-10-CM | POA: Insufficient documentation

## 2023-02-13 DIAGNOSIS — I159 Secondary hypertension, unspecified: Secondary | ICD-10-CM | POA: Insufficient documentation

## 2023-02-13 DIAGNOSIS — I10 Essential (primary) hypertension: Secondary | ICD-10-CM | POA: Diagnosis not present

## 2023-02-13 DIAGNOSIS — G4489 Other headache syndrome: Secondary | ICD-10-CM | POA: Diagnosis not present

## 2023-02-13 DIAGNOSIS — F0390 Unspecified dementia without behavioral disturbance: Secondary | ICD-10-CM | POA: Insufficient documentation

## 2023-02-13 DIAGNOSIS — Z79899 Other long term (current) drug therapy: Secondary | ICD-10-CM | POA: Insufficient documentation

## 2023-02-13 DIAGNOSIS — J449 Chronic obstructive pulmonary disease, unspecified: Secondary | ICD-10-CM | POA: Diagnosis not present

## 2023-02-13 DIAGNOSIS — R112 Nausea with vomiting, unspecified: Secondary | ICD-10-CM | POA: Diagnosis not present

## 2023-02-13 DIAGNOSIS — R531 Weakness: Secondary | ICD-10-CM | POA: Diagnosis not present

## 2023-02-13 LAB — CBC WITH DIFFERENTIAL/PLATELET
Abs Immature Granulocytes: 0.03 10*3/uL (ref 0.00–0.07)
Basophils Absolute: 0 10*3/uL (ref 0.0–0.1)
Basophils Relative: 0 %
Eosinophils Absolute: 0 10*3/uL (ref 0.0–0.5)
Eosinophils Relative: 0 %
HCT: 40.7 % (ref 36.0–46.0)
Hemoglobin: 13.9 g/dL (ref 12.0–15.0)
Immature Granulocytes: 0 %
Lymphocytes Relative: 11 %
Lymphs Abs: 0.8 10*3/uL (ref 0.7–4.0)
MCH: 30.8 pg (ref 26.0–34.0)
MCHC: 34.2 g/dL (ref 30.0–36.0)
MCV: 90.2 fL (ref 80.0–100.0)
Monocytes Absolute: 0.3 10*3/uL (ref 0.1–1.0)
Monocytes Relative: 5 %
Neutro Abs: 5.8 10*3/uL (ref 1.7–7.7)
Neutrophils Relative %: 84 %
Platelets: 272 10*3/uL (ref 150–400)
RBC: 4.51 MIL/uL (ref 3.87–5.11)
RDW: 12 % (ref 11.5–15.5)
WBC: 6.9 10*3/uL (ref 4.0–10.5)
nRBC: 0 % (ref 0.0–0.2)

## 2023-02-13 LAB — BASIC METABOLIC PANEL
Anion gap: 13 (ref 5–15)
Anion gap: 12 (ref 5–15)
BUN: 15 mg/dL (ref 8–23)
BUN: 15 mg/dL (ref 8–23)
CO2: 23 mmol/L (ref 22–32)
CO2: 24 mmol/L (ref 22–32)
Calcium: 9.8 mg/dL (ref 8.9–10.3)
Calcium: 9.1 mg/dL (ref 8.9–10.3)
Chloride: 89 mmol/L — ABNORMAL LOW (ref 98–111)
Chloride: 90 mmol/L — ABNORMAL LOW (ref 98–111)
Creatinine, Ser: 0.68 mg/dL (ref 0.44–1.00)
Creatinine, Ser: 0.7 mg/dL (ref 0.44–1.00)
GFR, Estimated: >60 mL/min (ref >60)
GFR, Estimated: >60 mL/min (ref >60)
Glucose, Bld: 129 mg/dL — ABNORMAL HIGH (ref 70–99)
Glucose, Bld: 117 mg/dL — ABNORMAL HIGH (ref 70–99)
Potassium: 3.7 mmol/L (ref 3.5–5.1)
Potassium: 3.2 mmol/L — ABNORMAL LOW (ref 3.5–5.1)
Sodium: 125 mmol/L — ABNORMAL LOW (ref 135–145)
Sodium: 126 mmol/L — ABNORMAL LOW (ref 135–145)

## 2023-02-13 MED ORDER — SODIUM CHLORIDE 1 G PO TABS: 1.0000 g | ORAL_TABLET | Freq: Two times a day (BID) | ORAL | 0 refills | Status: AC

## 2023-02-13 MED ORDER — ACETAMINOPHEN 325 MG PO TABS
650.0000 mg | ORAL_TABLET | Freq: Once | ORAL | Status: AC
  Administered 2023-02-13: 650 mg via ORAL
  Filled 2023-02-13: qty 2

## 2023-02-13 MED ORDER — SODIUM CHLORIDE 0.9 % IV BOLUS
1000.0000 mL | Freq: Once | INTRAVENOUS | Status: AC
  Administered 2023-02-13: 1000 mL via INTRAVENOUS

## 2023-02-13 MED ORDER — AMLODIPINE BESYLATE 5 MG PO TABS
5.0000 mg | ORAL_TABLET | Freq: Once | ORAL | Status: AC
  Administered 2023-02-13: 5 mg via ORAL
  Filled 2023-02-13: qty 1

## 2023-02-13 NOTE — ED Notes (Signed)
No answer when called several times from lobby 

## 2023-02-13 NOTE — ED Notes (Signed)
States she has missed yesterday and today's BP medications. Does not remember if she took it Tuesday.

## 2023-02-13 NOTE — ED Provider Notes (Signed)
[facility] Provider Note    Event Date/Time   First MD Initiated Contact with Patient 02/13/23 [phone]     (approximate)   History   Headache   HPI  Karina Stone is a 82 y.o. female past medical history including some mild dementia as well as hypertension presents to the ER for evaluation of increasing weakness and malaise over the past few days.  Has had decreased appetite.  Was recently started on triamterene and HCTZ blood pressure medication at the end of last month.  Has been feeling like symptoms are been getting worse over the past few days.  She denies any pain at this time.  There is some reported headache.  Was evaluated for similar symptoms on the second of this month.  No change in symptoms.  Had reassuring exam at that time with mild hyponatremia.  Daughter at bedside states the patient is not herself currently.     Physical Exam   Triage Vital Signs: ED Triage Vitals  Enc Vitals Group     BP 02/13/23 0517 (!) 134/100     Pulse Rate 02/13/23 0517 79     Resp 02/13/23 0517 18     Temp 02/13/23 0517 98 F (36.7 C)     Temp Source 02/13/23 0517 Oral     SpO2 02/13/23 0513 97 %     Weight 02/13/23 0518 130 lb (59 kg)     Height 02/13/23 0518 5\' 5"  (1.651 m)     Head Circumference --      Peak Flow --      Pain Score 02/13/23 0517 5     Pain Loc --      Pain Edu? --      Excl. in [place]? --     Most recent vital signs: Vitals:   02/13/23 0936 02/13/23 1041  BP: (!) 169/98 (!) 163/83  Pulse: 66   Resp: 16   Temp: 98.3 F (36.8 C)   SpO2: 93%      Constitutional: Alert  Eyes: Conjunctivae are normal.  Head: Atraumatic. Nose: No congestion/rhinnorhea. Mouth/Throat: Mucous membranes are moist.   Neck: Painless ROM.  Cardiovascular:   Good peripheral circulation. Respiratory: Normal respiratory effort.  No retractions.  Gastrointestinal: Soft and nontender.  Musculoskeletal:  no deformity Neurologic:  MAE spontaneously. No gross  focal neurologic deficits are appreciated.  Skin:  Skin is warm, dry and intact. No rash noted. Psychiatric: Mood and affect are normal. Speech and behavior are normal.    ED Results / Procedures / Treatments   Labs (all labs ordered are listed, but only abnormal results are displayed) Labs Reviewed  BASIC METABOLIC PANEL - Abnormal; Notable for the following components:      Result Value   Sodium 125 (*)    Chloride 89 (*)    Glucose, Bld 129 (*)    All other components within normal limits  BASIC METABOLIC PANEL - Abnormal; Notable for the following components:   Sodium 126 (*)    Potassium 3.2 (*)    Chloride 90 (*)    Glucose, Bld 117 (*)    All other components within normal limits  CBC WITH DIFFERENTIAL/PLATELET        RADIOLOGY  Please see ED Course for my review and interpretation.  I personally reviewed all radiographic images ordered to evaluate for the above acute complaints and reviewed radiology reports and findings.  These findings were personally discussed with the patient.  Please see  medical record for radiology report.   PROCEDURES:  Critical Care performed:   Procedures   MEDICATIONS ORDERED IN ED: Medications  sodium chloride 0.9 % bolus 1,000 mL (0 mLs Intravenous Stopped 02/13/23 0913)  amLODipine (NORVASC) tablet 5 mg (5 mg Oral Given 02/13/23 0926)  acetaminophen (TYLENOL) tablet 650 mg (650 mg Oral Given 02/13/23 0939)     IMPRESSION / MDM / ASSESSMENT AND PLAN / ED COURSE  I reviewed the triage vital signs and the nursing notes.                              Differential diagnosis includes, but is not limited to, Dehydration, sepsis, pna, uti, hypoglycemia, cva, drug effect, withdrawal, encephalitis  Patient presenting to the ER for evaluation of symptoms as described above.  Based on symptoms, risk factors and considered above differential, this presenting complaint could reflect a potentially life-threatening illness therefore the  patient will be placed on continuous pulse oximetry and telemetry for monitoring.  Laboratory evaluation will be sent to evaluate for the above complaints.  Believe this reflects acute CNS pathology just recently had CT imaging that was reassuring.  Think that her symptoms are more likely secondary to dehydration and some likely worsening hyponatremia secondary to poor p.o. intake as well as new blood pressure medication.  Will give IV fluids.  Her abdominal exam is soft benign.  No vomiting therefore I do not suspect obstruction or intra-abdominal process.  Clinical Course as of 02/13/23 1226  Thu Feb 13, 2023  [id number] Chest x-ray my review and interpretation without consolidation or infiltrate. [PR]  [phone] Patient reassessed.  Feels improved after some fluids.  Blood pressure is mildly elevated.  Discussed plan including consideration of admission to the hospital given her low sodium the patient states that she would prefer outpatient management.  Discussed 1 option of continued observation here in the ER for repeat sodium p.o. challenge and reassessment of sodium is improving after hydration she is tolerating p.o. outpatient management may be a reasonable option.  [PR]  [id number] Patient reassessed.  Repeat sodium only increased to 126.  I recommended admission to the hospital patient declining this stating that she has dealt with her low sodiums in the past with pills I do think the large component of this is the recent addition of her blood pressure medication.  She is tolerating p.o.  Per her request will be discharged.  Discussed signs and symptoms for which she should return to the ER. [PR]    Clinical Course User Index [PR] Merlyn Lot, MD     FINAL CLINICAL IMPRESSION(S) / ED DIAGNOSES   Final diagnoses:  Hyponatremia  Secondary hypertension     Rx / DC Orders   ED Discharge Orders          Ordered    sodium chloride 1 g tablet  2 times daily with meals        02/13/23 1131              Note:  This document was prepared using Dragon voice recognition software and may include unintentional dictation errors.    Merlyn Lot, MD 02/13/23 1226

## 2023-02-13 NOTE — ED Triage Notes (Addendum)
EMS brings pt in from home; was here earlier tonight but LWBS due to wait time; pt reports HA since last night accomp by nausea; denies any vomiting; st hx HA

## 2023-02-13 NOTE — Discharge Instructions (Signed)
Please stop taking your new blood pressure medication triamterene and HCTZ.  Take the sodium supplement that I have prescribed to your pharmacy.  Please call Dr. Ammie Ferrier office for close outpatient follow-up.

## 2023-02-13 NOTE — ED Notes (Signed)
Pt reports some confusion with headache, states that it has been coming on for awhile and that she gets like this when she is dehydrated, reports that she hasn't slept in 3 days, has been having vivid dreams about not being in her own house. Pt reports nausea, some vomiting yesterday, no appetite

## 2023-02-17 DIAGNOSIS — I10 Essential (primary) hypertension: Secondary | ICD-10-CM | POA: Diagnosis not present

## 2023-02-17 DIAGNOSIS — G9340 Encephalopathy, unspecified: Secondary | ICD-10-CM | POA: Diagnosis not present

## 2023-02-17 DIAGNOSIS — E869 Volume depletion, unspecified: Secondary | ICD-10-CM | POA: Diagnosis not present

## 2023-02-17 DIAGNOSIS — E871 Hypo-osmolality and hyponatremia: Secondary | ICD-10-CM | POA: Diagnosis not present

## 2023-02-18 ENCOUNTER — Telehealth: Payer: Self-pay

## 2023-02-18 NOTE — Telephone Encounter (Signed)
        Patient  visited [facility] on 02/13/2023  for headache.   Telephone encounter attempt :  1st  A HIPAA compliant voice message was left requesting a return call.  Instructed patient to call back at [phone].   Shenouda Genova [place]  [facility] Community Resource Care Guide   [email or web address]  ?? [phone]   Website: [email or web address]  [email or web address]    

## 2023-02-19 ENCOUNTER — Telehealth: Payer: Self-pay

## 2023-02-19 NOTE — Telephone Encounter (Signed)
        Patient  visited [facility] on 02/13/2023  for headache.   Telephone encounter attempt :  2nd  A HIPAA compliant voice message was left requesting a return call.  Instructed patient to call back at [phone].   Nelida Mandarino [place]  [facility] Community Resource Care Guide   [email or web address]  ?? [phone]   Website: [email or web address]  [email or web address]    

## 2023-02-24 DIAGNOSIS — E871 Hypo-osmolality and hyponatremia: Secondary | ICD-10-CM | POA: Diagnosis not present

## 2023-02-24 DIAGNOSIS — G9340 Encephalopathy, unspecified: Secondary | ICD-10-CM | POA: Diagnosis not present

## 2023-03-11 ENCOUNTER — Other Ambulatory Visit (INDEPENDENT_AMBULATORY_CARE_PROVIDER_SITE_OTHER): Payer: Self-pay | Admitting: Nurse Practitioner

## 2023-03-11 DIAGNOSIS — Z9889 Other specified postprocedural states: Secondary | ICD-10-CM

## 2023-03-14 ENCOUNTER — Ambulatory Visit (INDEPENDENT_AMBULATORY_CARE_PROVIDER_SITE_OTHER): Payer: PPO | Admitting: Vascular Surgery

## 2023-03-14 ENCOUNTER — Encounter (INDEPENDENT_AMBULATORY_CARE_PROVIDER_SITE_OTHER): Payer: PPO

## 2023-03-17 DIAGNOSIS — F5104 Psychophysiologic insomnia: Secondary | ICD-10-CM | POA: Diagnosis not present

## 2023-03-17 DIAGNOSIS — I739 Peripheral vascular disease, unspecified: Secondary | ICD-10-CM | POA: Diagnosis not present

## 2023-03-17 DIAGNOSIS — E871 Hypo-osmolality and hyponatremia: Secondary | ICD-10-CM | POA: Diagnosis not present

## 2023-03-27 DIAGNOSIS — E871 Hypo-osmolality and hyponatremia: Secondary | ICD-10-CM | POA: Diagnosis not present

## 2023-03-27 DIAGNOSIS — R5383 Other fatigue: Secondary | ICD-10-CM | POA: Diagnosis not present

## 2023-04-14 NOTE — Progress Notes (Signed)
Pt with new weakness- covid test ordered to evaluate for potential cause

## 2023-04-28 DIAGNOSIS — I10 Essential (primary) hypertension: Secondary | ICD-10-CM | POA: Diagnosis not present

## 2023-04-28 DIAGNOSIS — E871 Hypo-osmolality and hyponatremia: Secondary | ICD-10-CM | POA: Diagnosis not present

## 2023-05-02 DIAGNOSIS — E871 Hypo-osmolality and hyponatremia: Secondary | ICD-10-CM | POA: Diagnosis not present

## 2023-05-02 DIAGNOSIS — I10 Essential (primary) hypertension: Secondary | ICD-10-CM | POA: Diagnosis not present

## 2023-05-02 DIAGNOSIS — E860 Dehydration: Secondary | ICD-10-CM | POA: Diagnosis not present

## 2023-05-07 DIAGNOSIS — E871 Hypo-osmolality and hyponatremia: Secondary | ICD-10-CM | POA: Diagnosis not present

## 2023-05-07 DIAGNOSIS — I10 Essential (primary) hypertension: Secondary | ICD-10-CM | POA: Diagnosis not present

## 2023-06-10 DIAGNOSIS — G9349 Other encephalopathy: Secondary | ICD-10-CM | POA: Diagnosis not present

## 2023-06-10 DIAGNOSIS — E871 Hypo-osmolality and hyponatremia: Secondary | ICD-10-CM | POA: Diagnosis not present

## 2023-08-14 DIAGNOSIS — R739 Hyperglycemia, unspecified: Secondary | ICD-10-CM | POA: Diagnosis not present

## 2023-08-14 DIAGNOSIS — Z0000 Encounter for general adult medical examination without abnormal findings: Secondary | ICD-10-CM | POA: Diagnosis not present

## 2023-08-14 DIAGNOSIS — F0670 Mild neurocognitive disorder due to known physiological condition without behavioral disturbance: Secondary | ICD-10-CM | POA: Diagnosis not present

## 2023-08-14 DIAGNOSIS — E538 Deficiency of other specified B group vitamins: Secondary | ICD-10-CM | POA: Diagnosis not present

## 2023-08-14 DIAGNOSIS — E782 Mixed hyperlipidemia: Secondary | ICD-10-CM | POA: Diagnosis not present

## 2023-08-14 DIAGNOSIS — G309 Alzheimer's disease, unspecified: Secondary | ICD-10-CM | POA: Diagnosis not present

## 2023-08-14 DIAGNOSIS — Z23 Encounter for immunization: Secondary | ICD-10-CM | POA: Diagnosis not present

## 2023-09-23 DIAGNOSIS — F0670 Mild neurocognitive disorder due to known physiological condition without behavioral disturbance: Secondary | ICD-10-CM | POA: Diagnosis not present

## 2023-09-23 DIAGNOSIS — G2581 Restless legs syndrome: Secondary | ICD-10-CM | POA: Diagnosis not present

## 2023-09-23 DIAGNOSIS — M48062 Spinal stenosis, lumbar region with neurogenic claudication: Secondary | ICD-10-CM | POA: Diagnosis not present

## 2023-09-23 DIAGNOSIS — G309 Alzheimer's disease, unspecified: Secondary | ICD-10-CM | POA: Diagnosis not present

## 2023-10-13 DIAGNOSIS — F5104 Psychophysiologic insomnia: Secondary | ICD-10-CM | POA: Diagnosis not present

## 2023-10-13 DIAGNOSIS — I739 Peripheral vascular disease, unspecified: Secondary | ICD-10-CM | POA: Diagnosis not present

## 2023-12-17 ENCOUNTER — Other Ambulatory Visit: Payer: Self-pay | Admitting: Cardiology

## 2023-12-17 ENCOUNTER — Ambulatory Visit
Admission: RE | Admit: 2023-12-17 | Discharge: 2023-12-17 | Disposition: A | Discharge status: Home / Self Care | Payer: PPO | Source: Ambulatory Visit | Attending: Cardiology | Admitting: Cardiology

## 2023-12-17 ENCOUNTER — Other Ambulatory Visit: Payer: Self-pay | Admitting: Internal Medicine

## 2023-12-17 DIAGNOSIS — R222 Localized swelling, mass and lump, trunk: Secondary | ICD-10-CM | POA: Diagnosis not present

## 2023-12-17 DIAGNOSIS — K439 Ventral hernia without obstruction or gangrene: Secondary | ICD-10-CM | POA: Diagnosis not present

## 2023-12-17 DIAGNOSIS — I2510 Atherosclerotic heart disease of native coronary artery without angina pectoris: Secondary | ICD-10-CM

## 2023-12-17 DIAGNOSIS — K5730 Diverticulosis of large intestine without perforation or abscess without bleeding: Secondary | ICD-10-CM | POA: Diagnosis not present

## 2023-12-17 DIAGNOSIS — L02211 Cutaneous abscess of abdominal wall: Secondary | ICD-10-CM

## 2023-12-30 DIAGNOSIS — G309 Alzheimer's disease, unspecified: Secondary | ICD-10-CM | POA: Diagnosis not present

## 2023-12-30 DIAGNOSIS — F0670 Mild neurocognitive disorder due to known physiological condition without behavioral disturbance: Secondary | ICD-10-CM | POA: Diagnosis not present

## 2023-12-30 DIAGNOSIS — L02211 Cutaneous abscess of abdominal wall: Secondary | ICD-10-CM | POA: Diagnosis not present

## 2024-01-13 DIAGNOSIS — Z0000 Encounter for general adult medical examination without abnormal findings: Secondary | ICD-10-CM | POA: Diagnosis not present

## 2024-01-13 DIAGNOSIS — G309 Alzheimer's disease, unspecified: Secondary | ICD-10-CM | POA: Diagnosis not present

## 2024-01-13 DIAGNOSIS — F0670 Mild neurocognitive disorder due to known physiological condition without behavioral disturbance: Secondary | ICD-10-CM | POA: Diagnosis not present

## 2024-01-13 DIAGNOSIS — L02211 Cutaneous abscess of abdominal wall: Secondary | ICD-10-CM | POA: Diagnosis not present

## 2024-02-04 DIAGNOSIS — G309 Alzheimer's disease, unspecified: Secondary | ICD-10-CM | POA: Diagnosis not present

## 2024-02-04 DIAGNOSIS — G2581 Restless legs syndrome: Secondary | ICD-10-CM | POA: Diagnosis not present

## 2024-02-04 DIAGNOSIS — F0670 Mild neurocognitive disorder due to known physiological condition without behavioral disturbance: Secondary | ICD-10-CM | POA: Diagnosis not present

## 2024-04-12 DIAGNOSIS — E871 Hypo-osmolality and hyponatremia: Secondary | ICD-10-CM | POA: Diagnosis not present

## 2024-04-12 DIAGNOSIS — E538 Deficiency of other specified B group vitamins: Secondary | ICD-10-CM | POA: Diagnosis not present

## 2024-04-12 DIAGNOSIS — G309 Alzheimer's disease, unspecified: Secondary | ICD-10-CM | POA: Diagnosis not present

## 2024-04-12 DIAGNOSIS — L02211 Cutaneous abscess of abdominal wall: Secondary | ICD-10-CM | POA: Diagnosis not present

## 2024-04-12 DIAGNOSIS — F0670 Mild neurocognitive disorder due to known physiological condition without behavioral disturbance: Secondary | ICD-10-CM | POA: Diagnosis not present

## 2024-04-12 DIAGNOSIS — Z79899 Other long term (current) drug therapy: Secondary | ICD-10-CM | POA: Diagnosis not present

## 2024-04-12 DIAGNOSIS — R739 Hyperglycemia, unspecified: Secondary | ICD-10-CM | POA: Diagnosis not present

## 2024-04-21 DIAGNOSIS — B029 Zoster without complications: Secondary | ICD-10-CM | POA: Diagnosis not present

## 2024-04-21 DIAGNOSIS — M546 Pain in thoracic spine: Secondary | ICD-10-CM | POA: Diagnosis not present

## 2024-04-21 DIAGNOSIS — I10 Essential (primary) hypertension: Secondary | ICD-10-CM | POA: Diagnosis not present

## 2024-04-21 DIAGNOSIS — R1012 Left upper quadrant pain: Secondary | ICD-10-CM | POA: Diagnosis not present

## 2024-05-27 DIAGNOSIS — G4700 Insomnia, unspecified: Secondary | ICD-10-CM | POA: Diagnosis not present

## 2024-05-27 DIAGNOSIS — F4489 Other dissociative and conversion disorders: Secondary | ICD-10-CM | POA: Diagnosis not present

## 2024-05-27 DIAGNOSIS — R799 Abnormal finding of blood chemistry, unspecified: Secondary | ICD-10-CM | POA: Diagnosis not present

## 2024-05-27 DIAGNOSIS — R6883 Chills (without fever): Secondary | ICD-10-CM | POA: Diagnosis not present

## 2024-06-01 DIAGNOSIS — R630 Anorexia: Secondary | ICD-10-CM | POA: Diagnosis not present

## 2024-06-01 DIAGNOSIS — F0670 Mild neurocognitive disorder due to known physiological condition without behavioral disturbance: Secondary | ICD-10-CM | POA: Diagnosis not present

## 2024-06-01 DIAGNOSIS — G309 Alzheimer's disease, unspecified: Secondary | ICD-10-CM | POA: Diagnosis not present

## 2024-06-01 DIAGNOSIS — G4700 Insomnia, unspecified: Secondary | ICD-10-CM | POA: Diagnosis not present

## 2024-06-01 DIAGNOSIS — N390 Urinary tract infection, site not specified: Secondary | ICD-10-CM | POA: Diagnosis not present

## 2024-08-06 DIAGNOSIS — Z79899 Other long term (current) drug therapy: Secondary | ICD-10-CM | POA: Diagnosis not present

## 2024-08-06 DIAGNOSIS — E538 Deficiency of other specified B group vitamins: Secondary | ICD-10-CM | POA: Diagnosis not present

## 2024-08-13 DIAGNOSIS — Z1331 Encounter for screening for depression: Secondary | ICD-10-CM | POA: Diagnosis not present

## 2024-08-13 DIAGNOSIS — E538 Deficiency of other specified B group vitamins: Secondary | ICD-10-CM | POA: Diagnosis not present

## 2024-08-13 DIAGNOSIS — E782 Mixed hyperlipidemia: Secondary | ICD-10-CM | POA: Diagnosis not present

## 2024-08-13 DIAGNOSIS — G309 Alzheimer's disease, unspecified: Secondary | ICD-10-CM | POA: Diagnosis not present

## 2024-08-13 DIAGNOSIS — Z79899 Other long term (current) drug therapy: Secondary | ICD-10-CM | POA: Diagnosis not present

## 2024-08-13 DIAGNOSIS — Z0000 Encounter for general adult medical examination without abnormal findings: Secondary | ICD-10-CM | POA: Diagnosis not present

## 2024-08-13 DIAGNOSIS — F0670 Mild neurocognitive disorder due to known physiological condition without behavioral disturbance: Secondary | ICD-10-CM | POA: Diagnosis not present

## 2024-11-16 NOTE — Progress Notes (Signed)
 Karina Stone                                          MRN: [id number]   11/16/2024   The [facility] Quality Team Specialist reviewed this patient medical record for the purposes of chart review for care gap closure. The following were reviewed: chart review for care gap closure-controlling blood pressure.    [facility] Quality Team
